# Patient Record
Sex: Male | Born: 1955
Health system: Southern US, Community
[De-identification: ages and names within clinical notes are randomized; demographics above are authoritative.]

## PROBLEM LIST (undated history)

## (undated) DIAGNOSIS — I499 Cardiac arrhythmia, unspecified: Secondary | ICD-10-CM

## (undated) DIAGNOSIS — K259 Gastric ulcer, unspecified as acute or chronic, without hemorrhage or perforation: Secondary | ICD-10-CM

## (undated) DIAGNOSIS — M109 Gout, unspecified: Secondary | ICD-10-CM

## (undated) DIAGNOSIS — J189 Pneumonia, unspecified organism: Secondary | ICD-10-CM

## (undated) DIAGNOSIS — I219 Acute myocardial infarction, unspecified: Secondary | ICD-10-CM

## (undated) DIAGNOSIS — I10 Essential (primary) hypertension: Secondary | ICD-10-CM

## (undated) DIAGNOSIS — I2510 Atherosclerotic heart disease of native coronary artery without angina pectoris: Secondary | ICD-10-CM

## (undated) DIAGNOSIS — H3320 Serous retinal detachment, unspecified eye: Secondary | ICD-10-CM

## (undated) DIAGNOSIS — M719 Bursopathy, unspecified: Secondary | ICD-10-CM

## (undated) DIAGNOSIS — K219 Gastro-esophageal reflux disease without esophagitis: Secondary | ICD-10-CM

## (undated) DIAGNOSIS — M1990 Unspecified osteoarthritis, unspecified site: Secondary | ICD-10-CM

## (undated) DIAGNOSIS — J45909 Unspecified asthma, uncomplicated: Secondary | ICD-10-CM

## (undated) HISTORY — PX: CARDIOVASCULAR STRESS TEST: SHX262

## (undated) HISTORY — PX: COLONOSCOPY: SHX174

## (undated) HISTORY — PX: EYE SURGERY: SHX253

## (undated) HISTORY — PX: TOE AMPUTATION: SHX809

## (undated) HISTORY — PX: WRIST SURGERY: SHX841

## (undated) HISTORY — PX: APPENDECTOMY: SHX54

---

## 2003-01-16 ENCOUNTER — Ambulatory Visit (HOSPITAL_COMMUNITY): Admission: RE | Admit: 2003-01-16 | Discharge: 2003-01-16 | Payer: Self-pay | Admitting: Cardiology

## 2003-02-01 ENCOUNTER — Encounter: Admission: RE | Admit: 2003-02-01 | Discharge: 2003-02-01 | Payer: Self-pay | Admitting: Cardiology

## 2003-10-17 ENCOUNTER — Inpatient Hospital Stay (HOSPITAL_BASED_OUTPATIENT_CLINIC_OR_DEPARTMENT_OTHER): Admission: RE | Admit: 2003-10-17 | Discharge: 2003-10-17 | Payer: Self-pay | Admitting: Cardiology

## 2004-09-15 ENCOUNTER — Emergency Department (HOSPITAL_COMMUNITY): Admission: EM | Admit: 2004-09-15 | Discharge: 2004-09-15 | Payer: Self-pay | Admitting: Emergency Medicine

## 2005-09-07 ENCOUNTER — Emergency Department (HOSPITAL_COMMUNITY): Admission: EM | Admit: 2005-09-07 | Discharge: 2005-09-07 | Payer: Self-pay | Admitting: Emergency Medicine

## 2006-02-04 ENCOUNTER — Emergency Department (HOSPITAL_COMMUNITY): Admission: EM | Admit: 2006-02-04 | Discharge: 2006-02-04 | Payer: Self-pay | Admitting: Emergency Medicine

## 2006-06-23 ENCOUNTER — Emergency Department (HOSPITAL_COMMUNITY): Admission: EM | Admit: 2006-06-23 | Discharge: 2006-06-23 | Payer: Self-pay | Admitting: *Deleted

## 2006-06-26 ENCOUNTER — Encounter: Admission: RE | Admit: 2006-06-26 | Discharge: 2006-06-26 | Payer: Self-pay | Admitting: Cardiology

## 2009-02-16 ENCOUNTER — Encounter (HOSPITAL_COMMUNITY): Admission: RE | Admit: 2009-02-16 | Discharge: 2009-04-11 | Payer: Self-pay | Admitting: Cardiology

## 2009-02-21 ENCOUNTER — Ambulatory Visit: Payer: Self-pay | Admitting: Vascular Surgery

## 2009-02-21 ENCOUNTER — Ambulatory Visit (HOSPITAL_COMMUNITY): Admission: RE | Admit: 2009-02-21 | Discharge: 2009-02-21 | Payer: Self-pay | Admitting: Cardiology

## 2009-02-21 ENCOUNTER — Encounter (INDEPENDENT_AMBULATORY_CARE_PROVIDER_SITE_OTHER): Payer: Self-pay | Admitting: Cardiology

## 2009-04-03 ENCOUNTER — Ambulatory Visit (HOSPITAL_BASED_OUTPATIENT_CLINIC_OR_DEPARTMENT_OTHER): Admission: RE | Admit: 2009-04-03 | Discharge: 2009-04-03 | Payer: Self-pay | Admitting: Orthopedic Surgery

## 2009-06-22 ENCOUNTER — Emergency Department (HOSPITAL_COMMUNITY): Admission: EM | Admit: 2009-06-22 | Discharge: 2009-06-22 | Payer: Self-pay | Admitting: Emergency Medicine

## 2009-07-18 ENCOUNTER — Emergency Department (HOSPITAL_COMMUNITY): Admission: EM | Admit: 2009-07-18 | Discharge: 2009-07-19 | Payer: Self-pay | Admitting: Emergency Medicine

## 2009-08-30 ENCOUNTER — Encounter: Admission: RE | Admit: 2009-08-30 | Discharge: 2009-08-30 | Payer: Self-pay | Admitting: Neurosurgery

## 2009-10-09 ENCOUNTER — Encounter
Admission: RE | Admit: 2009-10-09 | Discharge: 2009-11-14 | Payer: Self-pay | Source: Home / Self Care | Admitting: Neurosurgery

## 2010-01-26 ENCOUNTER — Encounter: Payer: Self-pay | Admitting: Cardiology

## 2010-01-27 ENCOUNTER — Encounter: Payer: Self-pay | Admitting: Cardiology

## 2010-02-14 ENCOUNTER — Other Ambulatory Visit (HOSPITAL_COMMUNITY): Payer: Self-pay | Admitting: Cardiology

## 2010-03-08 ENCOUNTER — Other Ambulatory Visit (HOSPITAL_COMMUNITY): Payer: Self-pay

## 2010-03-11 ENCOUNTER — Ambulatory Visit (HOSPITAL_COMMUNITY)
Admission: RE | Admit: 2010-03-11 | Discharge: 2010-03-11 | Disposition: A | Discharge status: Home / Self Care | Payer: Medicaid Other | Source: Ambulatory Visit | Attending: Cardiology | Admitting: Cardiology

## 2010-03-11 ENCOUNTER — Ambulatory Visit (HOSPITAL_COMMUNITY): Payer: Self-pay

## 2010-03-11 DIAGNOSIS — R51 Headache: Secondary | ICD-10-CM | POA: Insufficient documentation

## 2010-03-11 DIAGNOSIS — R9439 Abnormal result of other cardiovascular function study: Secondary | ICD-10-CM | POA: Insufficient documentation

## 2010-03-11 DIAGNOSIS — R079 Chest pain, unspecified: Secondary | ICD-10-CM | POA: Insufficient documentation

## 2010-03-11 DIAGNOSIS — Z87891 Personal history of nicotine dependence: Secondary | ICD-10-CM | POA: Insufficient documentation

## 2010-03-11 DIAGNOSIS — I10 Essential (primary) hypertension: Secondary | ICD-10-CM | POA: Insufficient documentation

## 2010-03-11 DIAGNOSIS — R0602 Shortness of breath: Secondary | ICD-10-CM | POA: Insufficient documentation

## 2010-03-11 MED ORDER — TECHNETIUM TC 99M TETROFOSMIN IV KIT
30.0000 | PACK | Freq: Once | INTRAVENOUS | Status: AC | PRN
  Administered 2010-03-11: 30 via INTRAVENOUS

## 2010-03-11 MED ORDER — TECHNETIUM TC 99M TETROFOSMIN IV KIT
10.0000 | PACK | Freq: Once | INTRAVENOUS | Status: AC | PRN
  Administered 2010-03-11: 10 via INTRAVENOUS

## 2010-03-19 ENCOUNTER — Ambulatory Visit (HOSPITAL_COMMUNITY)
Admission: RE | Admit: 2010-03-19 | Discharge: 2010-03-19 | Disposition: A | Discharge status: Home / Self Care | Payer: Medicaid Other | Source: Ambulatory Visit | Attending: Cardiology | Admitting: Cardiology

## 2010-03-19 DIAGNOSIS — F17200 Nicotine dependence, unspecified, uncomplicated: Secondary | ICD-10-CM | POA: Insufficient documentation

## 2010-03-19 DIAGNOSIS — E7800 Pure hypercholesterolemia, unspecified: Secondary | ICD-10-CM | POA: Insufficient documentation

## 2010-03-19 DIAGNOSIS — I10 Essential (primary) hypertension: Secondary | ICD-10-CM | POA: Insufficient documentation

## 2010-03-19 DIAGNOSIS — I2510 Atherosclerotic heart disease of native coronary artery without angina pectoris: Secondary | ICD-10-CM | POA: Insufficient documentation

## 2010-03-19 DIAGNOSIS — Z7902 Long term (current) use of antithrombotics/antiplatelets: Secondary | ICD-10-CM | POA: Insufficient documentation

## 2010-03-19 DIAGNOSIS — Z7982 Long term (current) use of aspirin: Secondary | ICD-10-CM | POA: Insufficient documentation

## 2010-03-19 LAB — BASIC METABOLIC PANEL WITH GFR
BUN: 20 mg/dL (ref 6–23)
CO2: 23 meq/L (ref 19–32)
Calcium: 8.8 mg/dL (ref 8.4–10.5)
Chloride: 108 meq/L (ref 96–112)
Creatinine, Ser: 1.13 mg/dL (ref 0.4–1.5)
GFR calc non Af Amer: >60 mL/min
Glucose, Bld: 101 mg/dL — ABNORMAL HIGH (ref 70–99)
Potassium: 3.9 meq/L (ref 3.5–5.1)
Sodium: 137 meq/L (ref 135–145)

## 2010-04-01 LAB — BASIC METABOLIC PANEL
BUN: 11 mg/dL (ref 6–23)
CO2: 28 mEq/L (ref 19–32)
Calcium: 9.6 mg/dL (ref 8.4–10.5)
Chloride: 106 mEq/L (ref 96–112)
Creatinine, Ser: 0.96 mg/dL (ref 0.4–1.5)
GFR calc Af Amer: >60 mL/min (ref >60)
GFR calc non Af Amer: >60 mL/min (ref >60)
Glucose, Bld: 92 mg/dL (ref 70–99)
Potassium: 5 mEq/L (ref 3.5–5.1)
Sodium: 139 mEq/L (ref 135–145)
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
## 2010-05-03 ENCOUNTER — Emergency Department (HOSPITAL_COMMUNITY)
Admission: EM | Admit: 2010-05-03 | Discharge: 2010-05-03 | Disposition: A | Discharge status: Home / Self Care | Payer: Medicaid Other | Attending: Emergency Medicine | Admitting: Emergency Medicine

## 2010-05-03 ENCOUNTER — Emergency Department (HOSPITAL_COMMUNITY): Payer: Medicaid Other

## 2010-05-03 DIAGNOSIS — X500XXA Overexertion from strenuous movement or load, initial encounter: Secondary | ICD-10-CM | POA: Insufficient documentation

## 2010-05-03 DIAGNOSIS — S92909A Unspecified fracture of unspecified foot, initial encounter for closed fracture: Secondary | ICD-10-CM | POA: Insufficient documentation

## 2010-05-03 DIAGNOSIS — I10 Essential (primary) hypertension: Secondary | ICD-10-CM | POA: Insufficient documentation

## 2010-05-03 DIAGNOSIS — E7800 Pure hypercholesterolemia, unspecified: Secondary | ICD-10-CM | POA: Insufficient documentation

## 2010-05-06 ENCOUNTER — Emergency Department (HOSPITAL_COMMUNITY): Payer: Medicaid Other

## 2010-05-06 ENCOUNTER — Emergency Department (HOSPITAL_COMMUNITY)
Admission: EM | Admit: 2010-05-06 | Discharge: 2010-05-06 | Disposition: A | Discharge status: Home / Self Care | Payer: Medicaid Other | Attending: Emergency Medicine | Admitting: Emergency Medicine

## 2010-05-06 DIAGNOSIS — Z8614 Personal history of Methicillin resistant Staphylococcus aureus infection: Secondary | ICD-10-CM | POA: Insufficient documentation

## 2010-05-06 DIAGNOSIS — S52599A Other fractures of lower end of unspecified radius, initial encounter for closed fracture: Secondary | ICD-10-CM | POA: Insufficient documentation

## 2010-05-06 DIAGNOSIS — M25519 Pain in unspecified shoulder: Secondary | ICD-10-CM | POA: Insufficient documentation

## 2010-05-06 DIAGNOSIS — Y92009 Unspecified place in unspecified non-institutional (private) residence as the place of occurrence of the external cause: Secondary | ICD-10-CM | POA: Insufficient documentation

## 2010-05-06 DIAGNOSIS — I10 Essential (primary) hypertension: Secondary | ICD-10-CM | POA: Insufficient documentation

## 2010-05-06 DIAGNOSIS — M25539 Pain in unspecified wrist: Secondary | ICD-10-CM | POA: Insufficient documentation

## 2010-05-06 DIAGNOSIS — W010XXA Fall on same level from slipping, tripping and stumbling without subsequent striking against object, initial encounter: Secondary | ICD-10-CM | POA: Insufficient documentation

## 2010-05-24 NOTE — Cardiovascular Report (Signed)
NAME:  Danny Conner, SHAFF                   ACCOUNT NO.:  [id number]   MEDICAL RECORD NO.:  [id number]          PATIENT TYPE:  OIB   LOCATION:  6501                         FACILITY:  [facility]   PHYSICIAN:  Mohan N. Sharyn Lull, M.D. DATE OF BIRTH:  1955/05/23   DATE OF PROCEDURE:  DATE OF DISCHARGE:                              CARDIAC CATHETERIZATION   DATE OF PROCEDURE:  October 17, 2003.   PROCEDURE:  Left cardiac cath with selective left and right coronary  angiography and LV-graphy via the right groin using Judkins technique.   SURGEON:  Eduardo Osier. Sharyn Lull, MD.   INDICATIONS FOR PROCEDURE:  Danny Conner is a 55 year old black man with past  medical history significant for hypertension, hypercholesterolemia, severe  bronchial asthma, arthritis, bursitis, history of gouty arthritis, tobacco  abuse, and positive family history of coronary artery disease, complains of  retrosternal and precordial chest pain associated with mild shortness of  breath, palpitations, and diaphoresis associated with minimal exertion and  relieved with rest.  Chest pain is grade 7/10, which lasts a few minutes.  He states that left cath done 5 or 6 years ago at [facility] and was told  to have 40-50% blockages.  Patient also gives history of claudication in  veins, smokes less than 1 pack per day now, used to smoke 2-3 packs per day  for 20 plus years.  Denies PND, orthopnea, leg swelling.  Patient recently  had a Persantine-Cardiolite, which showed no evidence of ischemia with an EF  of 42%.  Due to recurrent typical anginal chest, decreased LV function,  multiple risk factors, discussed with patient regarding left cath, its risks  and benefits, and consented for PCI.   PROCEDURE:  After obtaining the informed consent, patient was brought to the  cath lab and was placed on fluoroscopy table.  The right groin was prepped  and draped in the usual fashion.  2% Xylocaine was used for local anesthesia  in the right groin.   With the help of thin-wall needle, a 6-French arterial  sheath was placed.  The sheath was aspirated and flushed.  Next, a 6-French  left Judkins catheter was advanced over the wire under fluoroscopic guidance  up to the ascending aorta.  While it was pulled out, the catheter was  aspirated and connected to the Manifold.  Catheter was further advanced and  engaged into left coronary ostium.  Multiple views of the left system were  taken.  Next, the catheter was disengaged and was pulled out over the wire  and was replaced with a 4-French right Judkins catheter, which was advanced  over the wire under fluoroscopic guidance up to the ascending aorta.  While  it was pulled out, the catheter was aspirated and connected to the Manifold.  Catheter was further advanced and engaged into the right coronary ostium.  Multiple views of the right system were taken.  Next, the catheter was  disengaged and was pulled out over the wire and was replaced with a 6-French  pigtail catheter, which was advanced over the wire under fluoroscopic  guidance up  to the ascending aorta.  While it was pulled out, the catheter  was aspirated and connected to the Manifold.  The catheter was further  advanced across the aortic valve into the LV.  LV pressures were recorded.  Next, an LV-graph was done in 30-degree RAO position.  Post-angiographic  pressures were recorded from LV, and then pullback pressures were recorded  from the aorta.  There was no gradient across the aortic valve.  Next, the  pigtail catheter was pulled out over the wire.  The sheaths were aspirated  and flushed.   FINDINGS:  The LV was mildly enlarged.  LVH EF of 45% approximately.  Left  main was patent.  LAD has 30% ostial stenosis, and 30-40% mid stenosis.  Diagonal 1 to diagonal 3 were very small, which were patient.  The ramus  was less than 0.5 mm, which was very small.  Left circumflex has 30-40% mid  stenosis.  OM-1 was moderate sized, which  has 40-50% proximal stenosis.  RCA  has 40-50% mid and distal junction stenosis.  Patient tolerated the  procedure well.  There were no complications.  Patient was transferred to  the recovery room in stable condition.       MNH/MEDQ  D:  10/17/2003  T:  10/17/2003  Job:  [id number]

## 2010-05-24 NOTE — Op Note (Signed)
NAME:  Danny, Conner                   ACCOUNT NO.:  [id number]   MEDICAL RECORD NO.:  [id number]          PATIENT TYPE:  EMS   LOCATION:  ED                           FACILITY:  [facility]   PHYSICIAN:  Vanita Panda. Magnus Ivan, M.D.DATE OF BIRTH:  06-Nov-1955   DATE OF PROCEDURE:  09/15/2004  DATE OF DISCHARGE:                                 OPERATIVE REPORT   PREOPERATIVE DIAGNOSES:  1.  Right thumb infection.  2.  Left knee superficial abscess.   POSTOPERATIVE DIAGNOSES:  1.  Right thumb complex felon.  2.  Left knee superficial abscess.   PROCEDURE:  1.  Irrigation and debridement of right thumb felon.  2.  Irrigation and debridement of left knee superficial abscess.   SURGEON:  Vanita Panda. Magnus Ivan, M.D.   ANESTHESIA:  0.25% plain Sensorcaine digital block for the right thumb and  local infiltration of skin with 0.25% plain Sensorcaine to the left knee.   ESTIMATED BLOOD LOSS:  Minimal.   COMPLICATIONS:  None.   ANTIBIOTICS:  1 gram IV vancomycin.   INDICATIONS FOR PROCEDURE:  [name] is a 55 year old gentleman who is right  hand dominant. He said that he had a previous hospitalization in  [place] several months ago for a MRSA infection involving his left  hand. He said over the last week, he had developed a similar boil on his  left knee and though it had not been too uncomfortable, it started out as a  scratch but then over the last week, he also developed on his right dominant  thumb exquisite pain and swelling. This worsened over the last 2 days and  then in the last 24 hours he then came to the emergency room to seek further  treatment. Orthopedic surgery was consulted to address the thumb and the  knee. He denied any other injuries to his body. His medical history was  significant for tobacco abuse as well as coronary artery disease, high blood  pressure and hyperlipidemia. These wounds were recognized and it was  recommended he undergo irrigation and  debridement of these at the bedside.  Informed consent was obtained.   DESCRIPTION OF PROCEDURE:  Both the right hand and the left knee were  prepped with Betadine extensively. A digital block was obtained first on the  thumb with 0.25% plain Sensorcaine. Once adequate block was obtained, an  incision was made over the thumb pad and a gross amount of purulence was  encountered here using a mosquito hemostat. Spreading was made around the  tuft of the thumb and in the septa of the thumb. Again this was the only  area growth purulence was encountered. Of note, there was no evidence of  flexor tendon synovitis on exam. Once this was carried out then a syringe  was used with at least a liter of normal saline solution running through the  syringe to clean the thumb. It was then packed with xeroform dressing as a  drain and then dressing was applied to this including Coban. Next, the knee  was addressed, the area around the small superficial  abscess was infiltrated  with Sensorcaine as well. An incision was made over the abscess area and  then a small amount of gross purulence was encountered. This did not appear  to affect the knee joint. There was no erythema associated with it and he  had full range of motion of his knee. The superficial tissue was cleaned  with an additional liter using a large Toomey syringe draining through this.  This likewise was packed with a drain and then dressing was applied to this.  The patient tolerated both these procedures. Tomorrow he is given specific  instructions to begin daily wet to dry dressing changes to both the thumb  and the knee with soaks as well. He will continue on oral doxicycline 100 mg  twice a day and followup __________ in the next 3-4 days in my office.           ______________________________  Vanita Panda. Magnus Ivan, M.D.     CYB/MEDQ  D:  09/15/2004  T:  09/16/2004  Job:  [id number]

## 2010-07-04 ENCOUNTER — Emergency Department (HOSPITAL_COMMUNITY)
Admission: EM | Admit: 2010-07-04 | Discharge: 2010-07-04 | Discharge status: Against Medical Advice | Payer: Medicaid Other | Attending: Emergency Medicine | Admitting: Emergency Medicine

## 2010-07-04 ENCOUNTER — Emergency Department (HOSPITAL_COMMUNITY): Payer: Medicaid Other

## 2010-07-04 DIAGNOSIS — F17200 Nicotine dependence, unspecified, uncomplicated: Secondary | ICD-10-CM | POA: Insufficient documentation

## 2010-07-04 DIAGNOSIS — R6889 Other general symptoms and signs: Secondary | ICD-10-CM | POA: Insufficient documentation

## 2010-07-04 DIAGNOSIS — I2510 Atherosclerotic heart disease of native coronary artery without angina pectoris: Secondary | ICD-10-CM | POA: Insufficient documentation

## 2010-07-04 DIAGNOSIS — I38 Endocarditis, valve unspecified: Secondary | ICD-10-CM | POA: Insufficient documentation

## 2010-07-04 DIAGNOSIS — E785 Hyperlipidemia, unspecified: Secondary | ICD-10-CM | POA: Insufficient documentation

## 2010-07-04 DIAGNOSIS — I10 Essential (primary) hypertension: Secondary | ICD-10-CM | POA: Insufficient documentation

## 2010-07-04 DIAGNOSIS — R1110 Vomiting, unspecified: Secondary | ICD-10-CM | POA: Insufficient documentation

## 2010-07-04 DIAGNOSIS — R5381 Other malaise: Secondary | ICD-10-CM | POA: Insufficient documentation

## 2010-07-04 DIAGNOSIS — R404 Transient alteration of awareness: Secondary | ICD-10-CM | POA: Insufficient documentation

## 2010-07-04 DIAGNOSIS — R55 Syncope and collapse: Secondary | ICD-10-CM | POA: Insufficient documentation

## 2010-07-04 LAB — URINALYSIS, ROUTINE W REFLEX MICROSCOPIC
Bilirubin Urine: NEGATIVE
Glucose, UA: NEGATIVE mg/dL
Hgb urine dipstick: NEGATIVE
Ketones, ur: NEGATIVE mg/dL
Leukocytes, UA: NEGATIVE
Nitrite: NEGATIVE
Protein, ur: NEGATIVE mg/dL
Specific Gravity, Urine: 1.028 (ref 1.005–1.030)
Urobilinogen, UA: 0.2 mg/dL (ref 0.0–1.0)
pH: 5.5 (ref 5.0–8.0)

## 2010-07-04 LAB — CBC
HCT: 38.9 % — ABNORMAL LOW (ref 39.0–52.0)
Hemoglobin: 13 g/dL (ref 13.0–17.0)
MCH: 30.5 pg (ref 26.0–34.0)
MCHC: 33.4 g/dL (ref 30.0–36.0)
MCV: 91.3 fL (ref 78.0–100.0)
Platelets: 186 10*3/uL (ref 150–400)
RBC: 4.26 MIL/uL (ref 4.22–5.81)
RDW: 13 % (ref 11.5–15.5)
WBC: 10.9 10*3/uL — ABNORMAL HIGH (ref 4.0–10.5)

## 2010-07-04 LAB — COMPREHENSIVE METABOLIC PANEL
ALT: 11 U/L (ref 0–53)
AST: 19 U/L (ref 0–37)
Albumin: 3.8 g/dL (ref 3.5–5.2)
Alkaline Phosphatase: 38 U/L — ABNORMAL LOW (ref 39–117)
BUN: 17 mg/dL (ref 6–23)
CO2: 24 mEq/L (ref 19–32)
Calcium: 8.2 mg/dL — ABNORMAL LOW (ref 8.4–10.5)
Chloride: 104 mEq/L (ref 96–112)
Creatinine, Ser: 0.92 mg/dL (ref 0.50–1.35)
GFR calc Af Amer: >60 mL/min (ref >60)
GFR calc non Af Amer: >60 mL/min (ref >60)
Glucose, Bld: 107 mg/dL — ABNORMAL HIGH (ref 70–99)
Potassium: 4.4 mEq/L (ref 3.5–5.1)
Sodium: 137 mEq/L (ref 135–145)
Total Bilirubin: 0.5 mg/dL (ref 0.3–1.2)
Total Protein: 6.9 g/dL (ref 6.0–8.3)

## 2010-07-04 LAB — RAPID URINE DRUG SCREEN, HOSP PERFORMED
Amphetamines: NOT DETECTED
Barbiturates: NOT DETECTED
Benzodiazepines: NOT DETECTED
Cocaine: NOT DETECTED
Opiates: NOT DETECTED
Tetrahydrocannabinol: POSITIVE — AB

## 2010-07-04 LAB — DIFFERENTIAL
Basophils Absolute: 0 10*3/uL (ref 0.0–0.1)
Basophils Relative: 0 % (ref 0–1)
Eosinophils Absolute: 0.1 10*3/uL (ref 0.0–0.7)
Eosinophils Relative: 1 % (ref 0–5)
Lymphocytes Relative: 9 % — ABNORMAL LOW (ref 12–46)
Lymphs Abs: 1 10*3/uL (ref 0.7–4.0)
Monocytes Absolute: 0.7 10*3/uL (ref 0.1–1.0)
Monocytes Relative: 6 % (ref 3–12)
Neutro Abs: 9.1 10*3/uL — ABNORMAL HIGH (ref 1.7–7.7)
Neutrophils Relative %: 84 % — ABNORMAL HIGH (ref 43–77)

## 2010-07-04 LAB — CK TOTAL AND CKMB (NOT AT ARMC)
CK, MB: 3.5 ng/mL (ref 0.3–4.0)
Relative Index: 1.7 (ref 0.0–2.5)
Total CK: 208 U/L (ref 7–232)

## 2010-07-04 LAB — TROPONIN I: Troponin I: <0.3 ng/mL (ref <0.30)

## 2010-07-04 LAB — LIPASE, BLOOD: Lipase: 73 U/L — ABNORMAL HIGH (ref 11–59)

## 2010-08-29 ENCOUNTER — Ambulatory Visit: Payer: Medicaid Other | Attending: Orthopedic Surgery | Admitting: Occupational Therapy

## 2010-10-23 LAB — CBC
HCT: 38.8 — ABNORMAL LOW
Hemoglobin: 13
MCHC: 33.4
MCV: 89.9
Platelets: 245
RBC: 4.32
RDW: 13.2
WBC: 10.3

## 2010-10-23 LAB — URINALYSIS, ROUTINE W REFLEX MICROSCOPIC
Bilirubin Urine: NEGATIVE
Glucose, UA: NEGATIVE
Hgb urine dipstick: NEGATIVE
Ketones, ur: NEGATIVE
Nitrite: NEGATIVE
Protein, ur: NEGATIVE
Specific Gravity, Urine: 1.02
Urobilinogen, UA: 0.2
pH: 7

## 2010-10-23 LAB — DIFFERENTIAL
Basophils Absolute: 0.1
Basophils Relative: 1
Eosinophils Absolute: 0.3
Eosinophils Relative: 3
Lymphocytes Relative: 9 — ABNORMAL LOW
Lymphs Abs: 0.9
Monocytes Absolute: 0.5
Monocytes Relative: 5
Neutro Abs: 8.5 — ABNORMAL HIGH
Neutrophils Relative %: 82 — ABNORMAL HIGH

## 2010-10-23 LAB — COMPREHENSIVE METABOLIC PANEL
ALT: 13
AST: 24
Albumin: 3.5
Alkaline Phosphatase: 39
BUN: 11
CO2: 23
Calcium: 8.8
Chloride: 107
Creatinine, Ser: 0.98
GFR calc Af Amer: >60
GFR calc non Af Amer: >60
Glucose, Bld: 127 — ABNORMAL HIGH
Potassium: 4
Sodium: 137
Total Bilirubin: 1.2
Total Protein: 6.8

## 2010-10-23 LAB — ETHANOL: Alcohol, Ethyl (B): <5

## 2010-10-23 LAB — LIPASE, BLOOD: Lipase: 39

## 2012-03-02 ENCOUNTER — Other Ambulatory Visit (HOSPITAL_COMMUNITY): Payer: Self-pay | Admitting: Cardiology

## 2012-03-02 DIAGNOSIS — R079 Chest pain, unspecified: Secondary | ICD-10-CM

## 2012-03-10 ENCOUNTER — Encounter (HOSPITAL_COMMUNITY)
Admission: RE | Admit: 2012-03-10 | Discharge: 2012-03-10 | Disposition: A | Discharge status: Home / Self Care | Payer: Medicaid Other | Source: Ambulatory Visit | Attending: Cardiology | Admitting: Cardiology

## 2012-03-10 ENCOUNTER — Ambulatory Visit (HOSPITAL_COMMUNITY)
Admission: RE | Admit: 2012-03-10 | Discharge: 2012-03-10 | Disposition: A | Discharge status: Home / Self Care | Payer: Medicaid Other | Source: Ambulatory Visit | Attending: Cardiology | Admitting: Cardiology

## 2012-03-10 DIAGNOSIS — R079 Chest pain, unspecified: Secondary | ICD-10-CM | POA: Insufficient documentation

## 2012-03-10 DIAGNOSIS — R9439 Abnormal result of other cardiovascular function study: Secondary | ICD-10-CM | POA: Insufficient documentation

## 2012-03-10 DIAGNOSIS — I4949 Other premature depolarization: Secondary | ICD-10-CM | POA: Insufficient documentation

## 2012-03-10 MED ORDER — TECHNETIUM TC 99M SESTAMIBI GENERIC - CARDIOLITE
30.0000 | Freq: Once | INTRAVENOUS | Status: AC | PRN
  Administered 2012-03-10: 30 via INTRAVENOUS

## 2012-03-10 MED ORDER — REGADENOSON 0.4 MG/5ML IV SOLN
INTRAVENOUS | Status: AC
  Filled 2012-03-10: qty 5

## 2012-03-10 MED ORDER — REGADENOSON 0.4 MG/5ML IV SOLN
0.4000 mg | Freq: Once | INTRAVENOUS | Status: AC
  Administered 2012-03-10: 0.4 mg via INTRAVENOUS

## 2012-03-10 MED ORDER — TECHNETIUM TC 99M SESTAMIBI GENERIC - CARDIOLITE
10.0000 | Freq: Once | INTRAVENOUS | Status: AC | PRN
  Administered 2012-03-10: 10 via INTRAVENOUS

## 2012-04-14 ENCOUNTER — Emergency Department (HOSPITAL_COMMUNITY)
Admission: EM | Admit: 2012-04-14 | Discharge: 2012-04-15 | Disposition: A | Discharge status: Home / Self Care | Payer: Medicaid Other | Attending: Emergency Medicine | Admitting: Emergency Medicine

## 2012-04-14 ENCOUNTER — Encounter (HOSPITAL_COMMUNITY): Payer: Self-pay | Admitting: Family Medicine

## 2012-04-14 DIAGNOSIS — R05 Cough: Secondary | ICD-10-CM | POA: Insufficient documentation

## 2012-04-14 DIAGNOSIS — J40 Bronchitis, not specified as acute or chronic: Secondary | ICD-10-CM

## 2012-04-14 DIAGNOSIS — R059 Cough, unspecified: Secondary | ICD-10-CM

## 2012-04-14 DIAGNOSIS — Z7982 Long term (current) use of aspirin: Secondary | ICD-10-CM | POA: Insufficient documentation

## 2012-04-14 DIAGNOSIS — J45909 Unspecified asthma, uncomplicated: Secondary | ICD-10-CM | POA: Insufficient documentation

## 2012-04-14 DIAGNOSIS — I10 Essential (primary) hypertension: Secondary | ICD-10-CM | POA: Insufficient documentation

## 2012-04-14 DIAGNOSIS — Z7902 Long term (current) use of antithrombotics/antiplatelets: Secondary | ICD-10-CM | POA: Insufficient documentation

## 2012-04-14 DIAGNOSIS — R079 Chest pain, unspecified: Secondary | ICD-10-CM

## 2012-04-14 DIAGNOSIS — R0602 Shortness of breath: Secondary | ICD-10-CM | POA: Insufficient documentation

## 2012-04-14 DIAGNOSIS — R509 Fever, unspecified: Secondary | ICD-10-CM | POA: Insufficient documentation

## 2012-04-14 DIAGNOSIS — Z720 Tobacco use: Secondary | ICD-10-CM

## 2012-04-14 DIAGNOSIS — F17200 Nicotine dependence, unspecified, uncomplicated: Secondary | ICD-10-CM | POA: Insufficient documentation

## 2012-04-14 HISTORY — DX: Unspecified asthma, uncomplicated: J45.909

## 2012-04-14 HISTORY — DX: Essential (primary) hypertension: I10

## 2012-04-14 NOTE — ED Notes (Signed)
Patient presents c/o cough, shortness of breath and chest pain. States chest pain has been coming off and on for 3 weeks. Has had a cough for the past couple of days with shortness of breath that is getting worse.

## 2012-04-14 NOTE — ED Notes (Signed)
ZOX:[id number]<AV> Expected date:<BR> Expected time:<BR> Means of arrival:<BR> Comments:<BR> TRIAGE 2

## 2012-04-15 ENCOUNTER — Emergency Department (HOSPITAL_COMMUNITY): Payer: Medicaid Other

## 2012-04-15 LAB — CBC WITH DIFFERENTIAL/PLATELET
Basophils Absolute: 0 10*3/uL (ref 0.0–0.1)
Basophils Relative: 0 % (ref 0–1)
Eosinophils Absolute: 0.3 10*3/uL (ref 0.0–0.7)
Eosinophils Relative: 4 % (ref 0–5)
HCT: 38.6 % — ABNORMAL LOW (ref 39.0–52.0)
Hemoglobin: 12.8 g/dL — ABNORMAL LOW (ref 13.0–17.0)
Lymphocytes Relative: 26 % (ref 12–46)
Lymphs Abs: 1.9 10*3/uL (ref 0.7–4.0)
MCH: 29.7 pg (ref 26.0–34.0)
MCHC: 33.2 g/dL (ref 30.0–36.0)
MCV: 89.6 fL (ref 78.0–100.0)
Monocytes Absolute: 0.8 10*3/uL (ref 0.1–1.0)
Monocytes Relative: 11 % (ref 3–12)
Neutro Abs: 4.2 10*3/uL (ref 1.7–7.7)
Neutrophils Relative %: 59 % (ref 43–77)
Platelets: 262 10*3/uL (ref 150–400)
RBC: 4.31 MIL/uL (ref 4.22–5.81)
RDW: 13.2 % (ref 11.5–15.5)
WBC: 7.1 10*3/uL (ref 4.0–10.5)

## 2012-04-15 LAB — BASIC METABOLIC PANEL
BUN: 22 mg/dL (ref 6–23)
CO2: 24 mEq/L (ref 19–32)
Calcium: 9.1 mg/dL (ref 8.4–10.5)
Chloride: 102 mEq/L (ref 96–112)
Creatinine, Ser: 0.99 mg/dL (ref 0.50–1.35)
GFR calc Af Amer: >90 mL/min (ref >90)
GFR calc non Af Amer: 89 mL/min — ABNORMAL LOW (ref >90)
Glucose, Bld: 129 mg/dL — ABNORMAL HIGH (ref 70–99)
Potassium: 3.5 mEq/L (ref 3.5–5.1)
Sodium: 137 mEq/L (ref 135–145)

## 2012-04-15 LAB — POCT I-STAT TROPONIN I: Troponin i, poc: 0.01 ng/mL (ref 0.00–0.08)

## 2012-04-15 MED ORDER — ASPIRIN 81 MG PO CHEW
324.0000 mg | CHEWABLE_TABLET | Freq: Once | ORAL | Status: AC
  Administered 2012-04-15: 324 mg via ORAL
  Filled 2012-04-15: qty 4

## 2012-04-15 MED ORDER — GUAIFENESIN ER 600 MG PO TB12
1200.0000 mg | ORAL_TABLET | Freq: Two times a day (BID) | ORAL | Status: DC
  Administered 2012-04-15: 1200 mg via ORAL
  Filled 2012-04-15: qty 2

## 2012-04-15 MED ORDER — GUAIFENESIN ER 600 MG PO TB12: 600.0000 mg | ORAL_TABLET | Freq: Two times a day (BID) | ORAL | Status: DC

## 2012-04-15 NOTE — ED Provider Notes (Addendum)
History     CSN: [id number]  Arrival date & time 04/14/12  2226   First MD Initiated Contact with Patient 04/14/12 2338      Chief Complaint  Patient presents with  . Chest Pain  . Shortness of Breath  . Cough    HPI Danny Conner is a 57 y.o. male presents with 3 weeks history of chest pain. He says the pain is on the left side of the chest is been intermittent and lasts for varying periods of time, but occasionally associated with shortness of breath, and a cough the last 3 weeks and is still a quarter to half pack smoker daily. (Coughing, SOB and Chest pain are temporally related.) He denies any nausea, vomiting, diaphoresis. He says the pain has been severe over the course the last couple weeks and days however it has gotten better since coming to the emergency department patient's history is also significant for prior cocaine use, hypertension, hypercholesterolemia pertinent family history of coronary artery disease including a father dying of MI at age 58, mother dying of MI at age 88, 3 brothers having coronary artery disease requiring PTCA stenting, one brother required CABG.  Patient recently had a Myoview stress test on 2/25 performed by Dr. Sharyn Lull his cardiologist which was negative for inducible ischemia. Patient's most recent cardiac catheterization was 03/19/2010 this showed diffuse coronary artery disease however no stenting was performed, no angioplasty, Dr. Annitta Jersey plan was to maximize anti-angina medications and lifestyle modifications patient denies any alcohol or illicit drug use.    Past Medical History  Diagnosis Date  . Hypertension   . Asthma     Past Surgical History  Procedure Laterality Date  . Cardiovascular stress test    . Appendectomy      No family history on file.  History  Substance Use Topics  . Smoking status: Current Every Day Smoker -- 0.50 packs/day    Types: Cigarettes  . Smokeless tobacco: Not on file  . Alcohol Use: No      Review of  Systems At least 10pt or greater review of systems completed and are negative except where specified in the HPI.  Allergies  Codeine  Home Medications   Current Outpatient Rx  Name  Route  Sig  Dispense  Refill  . aspirin 81 MG tablet   Oral   Take 81 mg by mouth daily.         . clopidogrel (PLAVIX) 75 MG tablet   Oral   Take 75 mg by mouth daily.           BP 140/95  Pulse 78  Temp(Src) 99.3 F (37.4 C) (Oral)  Resp 18  SpO2 96%  Physical Exam  Nursing notes reviewed.  Electronic medical record reviewed. VITAL SIGNS:   Filed Vitals:   04/14/12 2231 04/14/12 2355  BP: 140/95   Pulse: 78   Temp: 99.3 F (37.4 C)   TempSrc: Oral   Resp: 18   SpO2: 96% 96%   CONSTITUTIONAL: Awake, oriented, appears non-toxic HENT: Atraumatic, normocephalic, oral mucosa pink and moist, airway patent. Nares patent without drainage. External ears normal. EYES: Conjunctiva clear, EOMI, PERRLA NECK: Trachea midline, non-tender, supple CARDIOVASCULAR: Normal heart rate, Normal rhythm, No murmurs, rubs, gallops PULMONARY/CHEST: Clear to auscultation, no rhonchi, wheezes, or rales. Symmetrical breath sounds. Non-tender. ABDOMINAL: Non-distended, soft, non-tender - no rebound or guarding.  BS normal. NEUROLOGIC: Non-focal, moving all four extremities, no gross sensory or motor deficits. EXTREMITIES: No clubbing, cyanosis, or edema  SKIN: Warm, Dry, No erythema, No rash  ED Course  Procedures (including critical care time)  Date: 04/15/2012  Rate: 86  Rhythm: normal sinus rhythm  QRS Axis: normal  Intervals: First degree AV block  ST/T Wave abnormalities: normal  Conduction Disutrbances: none  Narrative Interpretation: unremarkable, PVC - no significant change from prior EKG  Labs Reviewed  CBC WITH DIFFERENTIAL - Abnormal; Notable for the following:    Hemoglobin 12.8 (*)    HCT 38.6 (*)    All other components within normal limits  BASIC METABOLIC PANEL - Abnormal; Notable  for the following:    Glucose, Bld 129 (*)    GFR calc non Af Amer 89 (*)    All other components within normal limits  POCT I-STAT TROPONIN I   Dg Chest 2 View  04/15/2012  *RADIOLOGY REPORT*  Clinical Data: Chest pain, shortness of breath and cough.  Fever.  CHEST - 2 VIEW  Comparison: Chest radiograph performed 07/04/2010  Findings: The lungs are relatively well-aerated.  Mild chronic peribronchial thickening is seen.  No focal consolidation, pleural effusion or pneumothorax is identified.  The heart is normal in size; the mediastinal contour is within normal limits.  No acute osseous abnormalities are seen.  IMPRESSION: Mild chronic peribronchial thickening noted; lungs otherwise grossly clear.   Original Report Authenticated By: Tonia Ghent, M.D.      1. Chest pain   2. Cough   3. Bronchitis   4. Tobacco abuse       MDM  [name] is a 57 y.o. male with known coronary artery disease, pertinent family history, presents with chest pain for 2-3 weeks and most recently associated with a cough.  Patient is still a daily smoker, and he says he's taking his medicine as prescribed his aspirin and Plavix. Do not have any record of this patient taking antihypertensives currently, but the patient is supposed to be taking metoprolol, Imdur, lisinopril and Crestor. Patient's also supposed to be using nitroglycerin. Patient does say that he stopped taking his nitrates because they're giving him a bad headache. My initial impression is that this patient has had some bronchitis with his chronic cough for 3 weeks.  In looking back at the patient's cardiac catheterization, he has significant and diffuse cardiac disease, there is no interventions done at that time, Dr. Lennette Bihari is no on 03/19/2010 from cardiac catheterization that he was intending to maximize anti-angina medications and lifestyle modifications. Patient says that he has not used cocaine in the last at least 10 years.  Patient workup  is unremarkable. His EKG is within normal limits, there are no ST elevations or depressions consistent with ischemia or infarction. Troponin is negative. This pain has been present for 3 weeks, he came in tonight because it has been persistent and wanted to get it checked out. Is been no acute change, so I would expect a troponin to be positive if this were cardiac. Patient had a Myoview performed in on February 25 of this year which was negative.    Discussed this patient's care with Dr. Sharyn Lull, Dr. Sharyn Lull will see the patient later today-I discussed this with the patient and he agrees that he would like to see Dr. Sharyn Lull the office for further discussions. I told the patient is likely having chest pain secondary to bronchitis, we will give him some medication to help move his mucus.  There is possible he is also having anginal chest pain, patient understands this as well, I told  him that he really needs to be on nitrate medication and that should help his pain if it were related to angina.  Patient does not need to go to the Cath Lab, he's had a recent Myoview - I. do not think this patient needs to be admitted, Dr. Sharyn Lull and the patient are in agreement with this assessment.  We'll discharge the patient home stable and in good condition with instructions to return to the emergency department should any new chest pain start, worsen or be associated with any other concerning symptoms. Patient understands accepts the medical plan as it's been dictated and agrees with it. His questions have been answered.           Jones Skene, MD 04/15/12 0830  Jones Skene, MD 04/15/12 4540

## 2012-09-17 ENCOUNTER — Emergency Department (HOSPITAL_COMMUNITY)
Admission: EM | Admit: 2012-09-17 | Discharge: 2012-09-17 | Disposition: A | Discharge status: Home / Self Care | Payer: Medicaid Other | Attending: Emergency Medicine | Admitting: Emergency Medicine

## 2012-09-17 ENCOUNTER — Emergency Department (HOSPITAL_COMMUNITY): Payer: Medicaid Other

## 2012-09-17 ENCOUNTER — Encounter (HOSPITAL_COMMUNITY): Payer: Self-pay | Admitting: Cardiology

## 2012-09-17 DIAGNOSIS — F17200 Nicotine dependence, unspecified, uncomplicated: Secondary | ICD-10-CM | POA: Insufficient documentation

## 2012-09-17 DIAGNOSIS — M79609 Pain in unspecified limb: Secondary | ICD-10-CM | POA: Insufficient documentation

## 2012-09-17 DIAGNOSIS — J45909 Unspecified asthma, uncomplicated: Secondary | ICD-10-CM | POA: Insufficient documentation

## 2012-09-17 DIAGNOSIS — Z79899 Other long term (current) drug therapy: Secondary | ICD-10-CM | POA: Insufficient documentation

## 2012-09-17 DIAGNOSIS — T4995XA Adverse effect of unspecified topical agent, initial encounter: Secondary | ICD-10-CM | POA: Insufficient documentation

## 2012-09-17 DIAGNOSIS — M7989 Other specified soft tissue disorders: Secondary | ICD-10-CM | POA: Insufficient documentation

## 2012-09-17 DIAGNOSIS — I10 Essential (primary) hypertension: Secondary | ICD-10-CM | POA: Insufficient documentation

## 2012-09-17 DIAGNOSIS — Z7982 Long term (current) use of aspirin: Secondary | ICD-10-CM | POA: Insufficient documentation

## 2012-09-17 DIAGNOSIS — M79642 Pain in left hand: Secondary | ICD-10-CM

## 2012-09-17 DIAGNOSIS — T7840XA Allergy, unspecified, initial encounter: Secondary | ICD-10-CM

## 2012-09-17 MED ORDER — PREDNISONE 20 MG PO TABS: ORAL_TABLET | ORAL | Status: DC

## 2012-09-17 MED ORDER — PREDNISONE 20 MG PO TABS
60.0000 mg | ORAL_TABLET | Freq: Once | ORAL | Status: AC
  Administered 2012-09-17: 60 mg via ORAL
  Filled 2012-09-17: qty 3

## 2012-09-17 MED ORDER — HYDROCODONE-ACETAMINOPHEN 5-325 MG PO TABS: 1.0000 | ORAL_TABLET | ORAL | Status: DC | PRN

## 2012-09-17 MED ORDER — HYDROCODONE-ACETAMINOPHEN 5-325 MG PO TABS
2.0000 | ORAL_TABLET | Freq: Once | ORAL | Status: AC
  Administered 2012-09-17: 2 via ORAL
  Filled 2012-09-17: qty 2

## 2012-09-17 NOTE — ED Provider Notes (Signed)
CSN: [id number]     Arrival date & time 09/17/12  1344 History   First MD Initiated Contact with Patient 09/17/12 1358     Chief Complaint  Patient presents with  . Hand Pain   (Consider location/radiation/quality/duration/timing/severity/associated sxs/prior Treatment) HPI Comments: 57 y/o male presents to the ED complaining of left hand pain and swelling that he noticed when he woke up this morning. Patient states he was working outside in the yard yesterday, had his hand inside pushes, unsure if he was bit by anything. Pain is "pretty bad" worse with movement. Denies difficulty breathing, swallowing, rashes. No known injury or trauma, however he does state he may have hit his hand on a tree. Denies numbness or tingling.  Patient is a 57 y.o. male presenting with hand pain. The history is provided by the patient.  Hand Pain Pertinent negatives include no chills or fever.    Past Medical History  Diagnosis Date  . Hypertension   . Asthma    Past Surgical History  Procedure Laterality Date  . Cardiovascular stress test    . Appendectomy     History reviewed. No pertinent family history. History  Substance Use Topics  . Smoking status: Current Every Day Smoker -- 0.50 packs/day    Types: Cigarettes  . Smokeless tobacco: Not on file  . Alcohol Use: Yes    Review of Systems  Constitutional: Negative for fever and chills.  HENT: Negative for trouble swallowing.   Respiratory: Negative for shortness of breath and wheezing.   Musculoskeletal:       Positive for left hand pain and swelling.  All other systems reviewed and are negative.    Allergies  Codeine  Home Medications   Current Outpatient Rx  Name  Route  Sig  Dispense  Refill  . aspirin 81 MG tablet   Oral   Take 81 mg by mouth daily.         . clopidogrel (PLAVIX) 75 MG tablet   Oral   Take 75 mg by mouth daily.         Marland Kitchen guaiFENesin (MUCINEX) 600 MG 12 hr tablet   Oral   Take 1 tablet (600 mg total)  by mouth 2 (two) times daily.   30 tablet   0    BP 138/86  Pulse 70  Temp(Src) 97.7 F (36.5 C) (Oral)  Resp 18  Ht 5\' 8"  (1.727 m)  Wt 160 lb (72.576 kg)  BMI 24.33 kg/m2  SpO2 97% Physical Exam  Nursing note and vitals reviewed. Constitutional: He is oriented to person, place, and time. He appears well-developed and well-nourished. No distress.  HENT:  Head: Normocephalic and atraumatic.  Mouth/Throat: Oropharynx is clear and moist.  Eyes: Conjunctivae are normal.  Neck: Normal range of motion. Neck supple.  Cardiovascular: Normal rate, regular rhythm, normal heart sounds and intact distal pulses.   Capillary refill < 3 seconds.  Pulmonary/Chest: Effort normal and breath sounds normal.  Musculoskeletal: Normal range of motion. He exhibits no edema.  Tender to palpation of 2-3 metacarpals with swelling. Pain increased with hand flexion. No erythema, bruising, wounds, warmth.  Neurological: He is alert and oriented to person, place, and time. No sensory deficit.  Skin: Skin is warm, dry and intact. No rash noted. He is not diaphoretic.  Psychiatric: He has a normal mood and affect. His behavior is normal.    ED Course  Procedures (including critical care time) Labs Review Labs Reviewed - No data to display  Imaging Review Dg Hand Complete Left  09/17/2012   *RADIOLOGY REPORT*  Clinical Data: Pain second and third metacarpal.  No specific injury.  Limited range of motion.  LEFT HAND - COMPLETE 3+ VIEW  Comparison: None.  Findings: No acute fracture or dislocation.  Question remote fracture left first metacarpal. Mild degenerative changes first metacarpal phalangeal joint space.  No bony erosion.  IMPRESSION: Question remote fracture left first metacarpal. Mild degenerative changes first metacarpal phalangeal joint space.   Original Report Authenticated By: Lacy Duverney, M.D.    MDM   1. Hand pain, left   2. Allergic reaction, initial encounter    Patient with pain and  swelling to left hand after working in the yard yesterday. States he possibly hit his hand on a tree, but unsure. Unsure if he was bit by anything. Swelling noted over the second and third metacarpals. Pain increased with hand flexion. Neurovascularly intact. X-ray showing a remote fracture over the left first metacarpal, however no acute fracture seen. There is no overlying erythema or warmth. 60 mg prednisone given in the emergency department in the event that this was from a bug bite, Vicodin for pain. Ace wrap applied. He will be given a short course of prednisone and Vicodin for pain. Advised ice and elevation. Followup with orthopedics and PCP. Close return precautions discussed. Patient states understanding of plan and is agreeable.    Trevor Mace, PA-C 09/17/12 1526

## 2012-09-17 NOTE — ED Notes (Signed)
Pt reports he was working in the yard yesterday and woke up this morning with swelling to the left hand. Swelling noted to top of hand, no redness noted. Pt reports increased pain with movement. 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

## 2012-09-18 NOTE — ED Provider Notes (Signed)
Medical screening examination/treatment/procedure(s) were performed by non-physician practitioner and as supervising physician I was immediately available for consultation/collaboration.  Dshaun Reppucci L Daysi Boggan, MD 09/18/12 0959 

## 2012-10-14 ENCOUNTER — Other Ambulatory Visit (HOSPITAL_COMMUNITY): Payer: Self-pay | Admitting: Internal Medicine

## 2012-10-14 DIAGNOSIS — R569 Unspecified convulsions: Secondary | ICD-10-CM

## 2012-10-22 ENCOUNTER — Ambulatory Visit (HOSPITAL_COMMUNITY)
Admission: RE | Admit: 2012-10-22 | Discharge: 2012-10-22 | Disposition: A | Discharge status: Home / Self Care | Payer: Medicaid Other | Source: Ambulatory Visit | Attending: Internal Medicine | Admitting: Internal Medicine

## 2012-10-22 DIAGNOSIS — R569 Unspecified convulsions: Secondary | ICD-10-CM

## 2012-10-22 DIAGNOSIS — R404 Transient alteration of awareness: Secondary | ICD-10-CM | POA: Insufficient documentation

## 2012-10-22 DIAGNOSIS — R4182 Altered mental status, unspecified: Secondary | ICD-10-CM | POA: Insufficient documentation

## 2012-10-22 DIAGNOSIS — R4789 Other speech disturbances: Secondary | ICD-10-CM | POA: Insufficient documentation

## 2012-10-22 NOTE — Progress Notes (Signed)
EEG Completed; Results Pending  

## 2012-10-22 NOTE — Procedures (Signed)
ELECTROENCEPHALOGRAM REPORT   Patient: Danny Conner       Room #: OP EEG No. ID: [id number] Age: 56 y.o.        Sex: male Referring Physician: Avbuere Report Date:  10/22/2012        Interpreting Physician: Aline Brochure  History: Danny Conner is an 57 y.o. male with a history of altered consciousness transiently, during which she had difficulty with speech output.  Indications for study:  Rule out focal seizure disorder.  Technique: This is an 18 channel routine scalp EEG performed at the bedside with bipolar and monopolar montages arranged in accordance to the international 10/20 system of electrode placement.   Description: This EEG recording was performed during wakefulness and during light sleep. Background activity during wakefulness consisted of 10 Hz symmetrical alpha rhythm which attenuates well with eye opening. Photic stimulation produced a symmetrical occipital driving response. Hyperventilation was not performed. During light sleep there was generalized slowing of background activity with mixed irregular symmetrical delta and theta activity. Stage II sleep was not achieved. No epileptiform discharges were recorded. There was no abnormal slowing.  Interpretation: This is a normal EEG recording during wakefulness and during light sleep. There is no evidence of an epileptic disorder demonstrated.   Venetia Maxon M.D. [facility] [phone]

## 2013-02-09 ENCOUNTER — Other Ambulatory Visit: Payer: Self-pay | Admitting: Internal Medicine

## 2013-02-09 DIAGNOSIS — R42 Dizziness and giddiness: Secondary | ICD-10-CM

## 2013-02-15 ENCOUNTER — Ambulatory Visit
Admission: RE | Admit: 2013-02-15 | Discharge: 2013-02-15 | Disposition: A | Discharge status: Home / Self Care | Payer: Medicaid Other | Source: Ambulatory Visit | Attending: Internal Medicine | Admitting: Internal Medicine

## 2013-02-15 ENCOUNTER — Inpatient Hospital Stay: Admission: RE | Admit: 2013-02-15 | Payer: Medicaid Other | Source: Ambulatory Visit

## 2013-02-15 ENCOUNTER — Other Ambulatory Visit: Payer: Self-pay | Admitting: Internal Medicine

## 2013-02-15 DIAGNOSIS — R42 Dizziness and giddiness: Secondary | ICD-10-CM

## 2013-06-14 ENCOUNTER — Emergency Department (HOSPITAL_COMMUNITY)
Admission: EM | Admit: 2013-06-14 | Discharge: 2013-06-15 | Disposition: A | Discharge status: Home / Self Care | Payer: Medicaid Other | Attending: Emergency Medicine | Admitting: Emergency Medicine

## 2013-06-14 ENCOUNTER — Encounter (HOSPITAL_COMMUNITY): Payer: Self-pay | Admitting: Emergency Medicine

## 2013-06-14 DIAGNOSIS — H539 Unspecified visual disturbance: Secondary | ICD-10-CM | POA: Insufficient documentation

## 2013-06-14 DIAGNOSIS — F17200 Nicotine dependence, unspecified, uncomplicated: Secondary | ICD-10-CM | POA: Insufficient documentation

## 2013-06-14 DIAGNOSIS — Z7902 Long term (current) use of antithrombotics/antiplatelets: Secondary | ICD-10-CM | POA: Insufficient documentation

## 2013-06-14 DIAGNOSIS — Z7982 Long term (current) use of aspirin: Secondary | ICD-10-CM | POA: Insufficient documentation

## 2013-06-14 DIAGNOSIS — Z79899 Other long term (current) drug therapy: Secondary | ICD-10-CM | POA: Insufficient documentation

## 2013-06-14 DIAGNOSIS — I2510 Atherosclerotic heart disease of native coronary artery without angina pectoris: Secondary | ICD-10-CM | POA: Insufficient documentation

## 2013-06-14 DIAGNOSIS — I10 Essential (primary) hypertension: Secondary | ICD-10-CM | POA: Insufficient documentation

## 2013-06-14 DIAGNOSIS — Z8719 Personal history of other diseases of the digestive system: Secondary | ICD-10-CM | POA: Insufficient documentation

## 2013-06-14 DIAGNOSIS — J45909 Unspecified asthma, uncomplicated: Secondary | ICD-10-CM | POA: Insufficient documentation

## 2013-06-14 HISTORY — DX: Cardiac arrhythmia, unspecified: I49.9

## 2013-06-14 HISTORY — DX: Atherosclerotic heart disease of native coronary artery without angina pectoris: I25.10

## 2013-06-14 HISTORY — DX: Gastric ulcer, unspecified as acute or chronic, without hemorrhage or perforation: K25.9

## 2013-06-14 NOTE — ED Notes (Signed)
Patient states that he was out working in his yard and he a flash of bright yellow light in his right eye. He denies pain now and during this episode. His eye doctor told him almost 2 years ago that if this ever happened that he needs to go the emergency department because it could cause permanent loss of vision.

## 2013-06-14 NOTE — ED Provider Notes (Signed)
CSN: [id number]     Arrival date & time 06/14/13  2155 History   First MD Initiated Contact with Patient 06/14/13 2237     Chief Complaint  Patient presents with  . Eye Problem     (Consider location/radiation/quality/duration/timing/severity/associated sxs/prior Treatment) The history is provided by the patient and medical records. No language interpreter was used.    Numa Schroeter is a 58 y.o. male  with a hx of HTN, asthma, CAD, PUD presents to the Emergency Department complaining of gradual, persistent, progressively worsening flashing light in the right eye onset 1pm today.  Pt reports no hx of eye problems but does wear glasses.  Dr. Shea Evans is opthalmology.  No associated symptoms.  Pt reports the flashes are intermittent and like a "lighthening bolt."  Pt is unsure how often this is happening.  No aggravating or alleviating factors.  Pt denies fever, chills, headache, neck pain, chest pain, SOB, abd pain, N/V/D, weakness, dizziness.  Pt denies heavy lifting or strenuous work today.  Pt smokes 0.5 packs per day, occasional EtOH, denies street drugs.       Past Medical History  Diagnosis Date  . Hypertension   . Asthma   . Coronary artery disease   . Irregular heart beat   . Stomach ulcer    Past Surgical History  Procedure Laterality Date  . Cardiovascular stress test    . Appendectomy     History reviewed. No pertinent family history. History  Substance Use Topics  . Smoking status: Current Every Day Smoker -- 0.50 packs/day    Types: Cigarettes  . Smokeless tobacco: Not on file  . Alcohol Use: 1.2 oz/week    1 Cans of beer, 1 Shots of liquor per week    Review of Systems  Constitutional: Negative for fever, diaphoresis, appetite change, fatigue and unexpected weight change.  HENT: Negative for mouth sores.   Eyes: Positive for visual disturbance. Negative for photophobia, pain, discharge, redness and itching.  Respiratory: Negative for cough, chest tightness, shortness of  breath and wheezing.   Cardiovascular: Negative for chest pain.  Gastrointestinal: Negative for nausea, vomiting, abdominal pain, diarrhea and constipation.  Endocrine: Negative for polydipsia, polyphagia and polyuria.  Genitourinary: Negative for dysuria, urgency, frequency and hematuria.  Musculoskeletal: Negative for back pain and neck stiffness.  Skin: Negative for rash.  Allergic/Immunologic: Negative for immunocompromised state.  Neurological: Negative for syncope, light-headedness and headaches.  Hematological: Does not bruise/bleed easily.  Psychiatric/Behavioral: Negative for sleep disturbance. The patient is not nervous/anxious.       Allergies  Codeine  Home Medications   Prior to Admission medications   Medication Sig Start Date End Date Taking? Authorizing Provider  aspirin 81 MG tablet Take 81 mg by mouth daily.   Yes Historical Provider, MD  clopidogrel (PLAVIX) 75 MG tablet Take 75 mg by mouth daily.   Yes Historical Provider, MD  lisinopril (PRINIVIL,ZESTRIL) 10 MG tablet Take 10 mg by mouth daily.   Yes Historical Provider, MD  Multiple Vitamin (MULTIVITAMIN WITH MINERALS) TABS tablet Take 1 tablet by mouth daily.   Yes Historical Provider, MD  pantoprazole (PROTONIX) 40 MG tablet Take 40 mg by mouth daily.   Yes Historical Provider, MD   BP 131/84  Pulse 82  Temp(Src) 99 F (37.2 C) (Oral)  Resp 14  SpO2 97% Physical Exam  Nursing note and vitals reviewed. Constitutional: He appears well-developed and well-nourished. No distress.  Awake, alert, nontoxic appearance  HENT:  Head: Normocephalic and atraumatic.  Right Ear: External ear normal.  Left Ear: External ear normal.  Nose: Nose normal.  Mouth/Throat: Oropharynx is clear and moist. No oropharyngeal exudate.  Eyes: Conjunctivae, EOM and lids are normal. Pupils are equal, round, and reactive to light. Right eye exhibits no chemosis, no discharge, no exudate and no hordeolum. No foreign body present in  the right eye. Left eye exhibits no chemosis, no discharge, no exudate and no hordeolum. No foreign body present in the left eye. Right conjunctiva is not injected. Right conjunctiva has no hemorrhage. Left conjunctiva is not injected. Left conjunctiva has no hemorrhage. No scleral icterus. Right eye exhibits normal extraocular motion and no nystagmus. Left eye exhibits normal extraocular motion and no nystagmus.  Fundoscopic exam:      The right eye shows no arteriolar narrowing, no AV nicking, no exudate, no hemorrhage and no papilledema.       The left eye shows no arteriolar narrowing, no AV nicking, no exudate, no hemorrhage and no papilledema.  Slit lamp exam:      The right eye shows no corneal abrasion, no corneal flare, no corneal ulcer, no foreign body, no fluorescein uptake and no anterior chamber bulge.       The left eye shows no corneal abrasion, no corneal flare, no corneal ulcer, no foreign body, no fluorescein uptake and no anterior chamber bulge.  Full extraocular movements without diplopia or pain Peripheral fields intact without deficit No nystagmus  Visual Acuity - Bilateral Distance: 20/30 ; R Distance: 20/40 ; L Distance: 20/40 (Pt reports vision was clearer)  Neck: Normal range of motion. Neck supple.  Cardiovascular: Normal rate, regular rhythm, normal heart sounds and intact distal pulses.   No murmur heard. Pulmonary/Chest: Effort normal and breath sounds normal. No respiratory distress. He has no wheezes.  Abdominal: There is no rebound.  Musculoskeletal: Normal range of motion. He exhibits no edema.  Neurological: He is alert.  Mental Status:  Alert, oriented, thought content appropriate. Speech fluent without evidence of aphasia. Able to follow 2 step commands without difficulty.  Cranial Nerves:  II:  Peripheral visual fields grossly normal, pupils equal, round, reactive to light III,IV, VI: ptosis not present, extra-ocular motions intact bilaterally  V,VII:  smile symmetric, facial light touch sensation equal VIII: hearing grossly normal bilaterally  IX,X: gag reflex present  XI: bilateral shoulder shrug equal and strong XII: midline tongue extension    Skin: Skin is warm and dry. He is not diaphoretic.  Psychiatric: He has a normal mood and affect.    ED Course  Procedures (including critical care time) Labs Review Labs Reviewed - No data to display  Imaging Review No results found.   EKG Interpretation None      EMERGENCY DEPARTMENT [place] EYE INTERPRETATION "Study: Limited Ultrasound of the noted body part in comments below"  INDICATIONS: Flashes of light in the right high Multiple views of the body part are obtained with a multi-frequency linear probe  PERFORMED BY:  Myself  IMAGES ARCHIVED?: Yes  SIDE:Right   BODY PART: Right globe  FINDINGS: Other Questionable retinal detachment  LIMITATIONS:  Difficulty visualizing complete retina  INTERPRETATION:  Questionable retinal detachment  COMMENT:  Unable to visualize entire retina question possible detachment   MDM   Final diagnoses:  None   Ladarious Schriever presents with findings would flashes in his right eye since 1 PM today.  Visual acuity within normal limits and no peripheral field deficits. Ultrasound used, but unable to visualize the entire retina  however I question possible retinal detachment on ultrasound.  12:54AM Discussed with Dr. Vonna KotykBevis who will see the patient in his office tonight. Patient has been given instructions to proceed directly to his office.  I have personally reviewed patient's vitals, nursing note and any pertinent labs or imaging.  I performed an undressed physical exam.    At this time, the possibility of further emergency conditions for the right eye will be evaluated by Dr. Vonna KotykBevis. The patient/guardian have been advised of the diagnosis and plan. I reviewed all labs and imaging including any potential incidental findings.   Vital signs are  stable at discharge.   BP 131/84  Pulse 82  Temp(Src) 99 F (37.2 C) (Oral)  Resp 14  SpO2 97%        Dierdre ForthHannah Savon Bordonaro, PA-C 06/16/13 0025

## 2013-06-16 NOTE — ED Provider Notes (Signed)
Medical screening examination/treatment/procedure(s) were performed by non-physician practitioner and as supervising physician I was immediately available for consultation/collaboration.   EKG Interpretation None        Donyetta Ogletree, MD 06/16/13 0706 

## 2013-07-29 ENCOUNTER — Encounter (HOSPITAL_COMMUNITY): Payer: Self-pay | Admitting: Emergency Medicine

## 2013-07-29 ENCOUNTER — Emergency Department (HOSPITAL_COMMUNITY): Payer: Medicaid Other

## 2013-07-29 ENCOUNTER — Emergency Department (HOSPITAL_COMMUNITY)
Admission: EM | Admit: 2013-07-29 | Discharge: 2013-07-29 | Disposition: A | Discharge status: Home / Self Care | Payer: Medicaid Other | Attending: Emergency Medicine | Admitting: Emergency Medicine

## 2013-07-29 DIAGNOSIS — Z8669 Personal history of other diseases of the nervous system and sense organs: Secondary | ICD-10-CM | POA: Insufficient documentation

## 2013-07-29 DIAGNOSIS — Z79899 Other long term (current) drug therapy: Secondary | ICD-10-CM | POA: Diagnosis not present

## 2013-07-29 DIAGNOSIS — IMO0002 Reserved for concepts with insufficient information to code with codable children: Secondary | ICD-10-CM | POA: Diagnosis not present

## 2013-07-29 DIAGNOSIS — Z7982 Long term (current) use of aspirin: Secondary | ICD-10-CM | POA: Diagnosis not present

## 2013-07-29 DIAGNOSIS — J45909 Unspecified asthma, uncomplicated: Secondary | ICD-10-CM | POA: Diagnosis not present

## 2013-07-29 DIAGNOSIS — F17200 Nicotine dependence, unspecified, uncomplicated: Secondary | ICD-10-CM | POA: Insufficient documentation

## 2013-07-29 DIAGNOSIS — R5383 Other fatigue: Secondary | ICD-10-CM | POA: Diagnosis not present

## 2013-07-29 DIAGNOSIS — R110 Nausea: Secondary | ICD-10-CM | POA: Insufficient documentation

## 2013-07-29 DIAGNOSIS — R5381 Other malaise: Secondary | ICD-10-CM | POA: Diagnosis not present

## 2013-07-29 DIAGNOSIS — I2510 Atherosclerotic heart disease of native coronary artery without angina pectoris: Secondary | ICD-10-CM | POA: Diagnosis not present

## 2013-07-29 DIAGNOSIS — I10 Essential (primary) hypertension: Secondary | ICD-10-CM | POA: Diagnosis not present

## 2013-07-29 DIAGNOSIS — Z8719 Personal history of other diseases of the digestive system: Secondary | ICD-10-CM | POA: Diagnosis not present

## 2013-07-29 DIAGNOSIS — Z7902 Long term (current) use of antithrombotics/antiplatelets: Secondary | ICD-10-CM | POA: Insufficient documentation

## 2013-07-29 DIAGNOSIS — R42 Dizziness and giddiness: Secondary | ICD-10-CM | POA: Diagnosis present

## 2013-07-29 HISTORY — DX: Serous retinal detachment, unspecified eye: H33.20

## 2013-07-29 LAB — COMPREHENSIVE METABOLIC PANEL
ALT: 13 U/L (ref 0–53)
AST: 29 U/L (ref 0–37)
Albumin: 4 g/dL (ref 3.5–5.2)
Alkaline Phosphatase: 45 U/L (ref 39–117)
Anion gap: 13 (ref 5–15)
BUN: 18 mg/dL (ref 6–23)
CO2: 22 mEq/L (ref 19–32)
Calcium: 9.8 mg/dL (ref 8.4–10.5)
Chloride: 101 mEq/L (ref 96–112)
Creatinine, Ser: 0.88 mg/dL (ref 0.50–1.35)
GFR calc Af Amer: >90 mL/min (ref >90)
GFR calc non Af Amer: >90 mL/min (ref >90)
Glucose, Bld: 95 mg/dL (ref 70–99)
Potassium: 5.4 mEq/L — ABNORMAL HIGH (ref 3.7–5.3)
Sodium: 136 mEq/L — ABNORMAL LOW (ref 137–147)
Total Bilirubin: 0.5 mg/dL (ref 0.3–1.2)
Total Protein: 7.8 g/dL (ref 6.0–8.3)

## 2013-07-29 LAB — CBC WITH DIFFERENTIAL/PLATELET
Basophils Absolute: 0 10*3/uL (ref 0.0–0.1)
Basophils Relative: 0 % (ref 0–1)
Eosinophils Absolute: 0.2 10*3/uL (ref 0.0–0.7)
Eosinophils Relative: 3 % (ref 0–5)
HCT: 42.3 % (ref 39.0–52.0)
Hemoglobin: 14 g/dL (ref 13.0–17.0)
Lymphocytes Relative: 25 % (ref 12–46)
Lymphs Abs: 1.6 10*3/uL (ref 0.7–4.0)
MCH: 30 pg (ref 26.0–34.0)
MCHC: 33.1 g/dL (ref 30.0–36.0)
MCV: 90.6 fL (ref 78.0–100.0)
Monocytes Absolute: 0.5 10*3/uL (ref 0.1–1.0)
Monocytes Relative: 8 % (ref 3–12)
Neutro Abs: 4.2 10*3/uL (ref 1.7–7.7)
Neutrophils Relative %: 64 % (ref 43–77)
Platelets: 253 10*3/uL (ref 150–400)
RBC: 4.67 MIL/uL (ref 4.22–5.81)
RDW: 13 % (ref 11.5–15.5)
WBC: 6.5 10*3/uL (ref 4.0–10.5)

## 2013-07-29 MED ORDER — ONDANSETRON 8 MG PO TBDP: 8.0000 mg | ORAL_TABLET | Freq: Three times a day (TID) | ORAL | Status: DC | PRN

## 2013-07-29 MED ORDER — MECLIZINE HCL 25 MG PO TABS
25.0000 mg | ORAL_TABLET | Freq: Once | ORAL | Status: AC
  Administered 2013-07-29: 25 mg via ORAL
  Filled 2013-07-29: qty 1

## 2013-07-29 MED ORDER — MECLIZINE HCL 25 MG PO TABS: 25.0000 mg | ORAL_TABLET | Freq: Four times a day (QID) | ORAL | Status: DC

## 2013-07-29 NOTE — ED Notes (Signed)
Initial Contact - Pt ret from MRI at this time, ambulatory with steady gait to void in BR.  Pt continues to c/o dizziness.  Denies other needs/complaints.  Neuros grossly intact.  Skin PWD.  A+Ox4.  MAEI.  NAD.

## 2013-07-29 NOTE — ED Provider Notes (Signed)
CSN: [id number]     Arrival date & time 07/29/13  9562 History   First MD Initiated Contact with Patient 07/29/13 1030     Chief Complaint  Patient presents with  . Weakness  . Dizziness  . Nausea     (Consider location/radiation/quality/duration/timing/severity/associated sxs/prior Treatment) HPI  58 year old male comes in today complaining of onset of vertigo this morning after awakening. He states he attempted to move in bed and felt very woozy and nauseated. He did not vomit. He has had repair of a recent retinal detachment last Saturday. He was told not to do things like ache that increases intracranial pressure and is very concerned about his right eye with the surgery is done. He has had no change in the amount of vision. Vision is slowly coming back and back appear somewhat improved today. He has some diffuse pain around that area denies it is a change from prior. He denies any lateralized changes such as weakness in his right hand or left hand. He has been ambulatory but has worsening of his vertigo with this.  Past Medical History  Diagnosis Date  . Hypertension   . Asthma   . Coronary artery disease   . Irregular heart beat   . Stomach ulcer   . Retinal detachment     right eye July 2014   Past Surgical History  Procedure Laterality Date  . Cardiovascular stress test    . Appendectomy     History reviewed. No pertinent family history. History  Substance Use Topics  . Smoking status: Current Every Day Smoker -- 0.50 packs/day    Types: Cigarettes  . Smokeless tobacco: Not on file  . Alcohol Use: 1.2 oz/week    1 Cans of beer, 1 Shots of liquor per week     Comment: daily     Review of Systems  All other systems reviewed and are negative.     Allergies  Codeine  Home Medications   Prior to Admission medications   Medication Sig Start Date End Date Taking? Authorizing Provider  ARTIFICIAL TEAR OINTMENT OP Apply 1 strip to eye daily as needed (for  dryness).   Yes Historical Provider, MD  aspirin 81 MG tablet Take 81 mg by mouth daily.   Yes Historical Provider, MD  clopidogrel (PLAVIX) 75 MG tablet Take 75 mg by mouth daily.   Yes Historical Provider, MD  lisinopril (PRINIVIL,ZESTRIL) 10 MG tablet Take 10 mg by mouth daily.   Yes Historical Provider, MD  Multiple Vitamin (MULTIVITAMIN WITH MINERALS) TABS tablet Take 1 tablet by mouth daily.   Yes Historical Provider, MD  pantoprazole (PROTONIX) 40 MG tablet Take 40 mg by mouth daily.   Yes Historical Provider, MD  prednisoLONE acetate (PRED FORTE) 1 % ophthalmic suspension Place 1 drop into the right eye 3 (three) times daily.   Yes Historical Provider, MD   BP 136/72  Pulse 54  Temp(Src) 97.7 F (36.5 C) (Oral)  Resp 16  SpO2 100% Physical Exam  Nursing note and vitals reviewed. Constitutional: He is oriented to person, place, and time. He appears well-developed and well-nourished.  HENT:  Head: Normocephalic and atraumatic.  Right Ear: External ear normal.  Left Ear: External ear normal.  Eyes: Conjunctivae and EOM are normal.  Unable to visualize right fundus of the eye left eye exam shows normal funduscopic exam  Neck: Normal range of motion. Neck supple.  Cardiovascular: Normal rate, regular rhythm, normal heart sounds and intact distal pulses.   Pulmonary/Chest:  Effort normal and breath sounds normal.  Abdominal: Soft. Bowel sounds are normal.  Musculoskeletal: Normal range of motion.  Neurological: He is alert and oriented to person, place, and time. He has normal reflexes. He displays normal reflexes. No cranial nerve deficit. He exhibits normal muscle tone. Coordination abnormal.  Patient with over point on the finger to nose bilaterally  Skin: Skin is warm and dry.  Psychiatric: He has a normal mood and affect. His behavior is normal. Judgment and thought content normal.    ED Course  Procedures (including critical care time) Labs Review Labs Reviewed   COMPREHENSIVE METABOLIC PANEL - Abnormal; Notable for the following:    Sodium 136 (*)    Potassium 5.4 (*)    All other components within normal limits  CBC WITH DIFFERENTIAL    Imaging Review Mr Brain Wo Contrast  07/29/2013   CLINICAL DATA:  Nausea vomiting and weakness.  EXAM: MRI HEAD WITHOUT CONTRAST  TECHNIQUE: Multiplanar, multiecho pulse sequences of the brain and surrounding structures were obtained without intravenous contrast.  COMPARISON:  CT head 02/15/2013  FINDINGS: Ventricle size is normal. Cerebral volume normal for age. Craniocervical junction is normal. Pituitary is normal in size.  Negative for acute infarct.  Several small white matter hyperintensities bilaterally, most consistent with chronic microvascular ischemia  Negative for hemorrhage or mass. No shift of the midline structures.  Mild mucosal edema in the paranasal sinuses.  IMPRESSION: Mild chronic microvascular ischemia.  No acute infarct.   Electronically Signed   By: Marlan Palauharles  Clark M.D.   On: 07/29/2013 12:24     EKG Interpretation None      MDM   Final diagnoses:  Vertigo    58 year old male who presents with vertigo and recent right eye surgery. I feel that he has normal upper voiding likely secondary to his eye surgery, however MRI was obtained to rule out stroke and is negative for stroke.  Eyes unable to visualize fundus on right eye and is unclear what vision he has had since surgery. I [facility]. He will see him in followup today. I have discussed this with the patient and he understands appear to go directly to the eye Center. His vertigo is being treated here with Antivert and he is given a prescription for Zofran. He is able to ambulate without difficulty.    Hilario Quarryanielle S Araya Roel, MD 08/01/13 1249

## 2013-07-29 NOTE — Discharge Instructions (Signed)
[facility]  They have you on the schedule this afternoon per Dr. Joelene Millinliver.    Vertigo Vertigo means you feel like you are moving when you are not. Vertigo can make you feel like things around you are moving when they are not. This problem often goes away on its own.  HOME CARE   Follow your doctor's instructions.  Avoid driving.  Avoid using heavy machinery.  Avoid doing any activity that could be dangerous if you have a vertigo attack.  Tell your doctor if a medicine seems to cause your vertigo. GET HELP RIGHT AWAY IF:   Your medicines do not help or make you feel worse.  You have trouble talking or walking.  You feel weak or have trouble using your arms, hands, or legs.  You have bad headaches.  You keep feeling sick to your stomach (nauseous) or throwing up (vomiting).  Your vision changes.  A family member notices changes in your behavior.  Your problems get worse. MAKE SURE YOU:  Understand these instructions.  Will watch your condition.  Will get help right away if you are not doing well or get worse. Document Released: 10/02/2007 Document Revised: 03/17/2011 Document Reviewed: 07/11/2010 [facility] Patient Information 2015 [employer] [place]. This information is not intended to replace advice given to you by your health care provider. Make sure you discuss any questions you have with your health care provider.

## 2013-07-29 NOTE — ED Notes (Signed)
MD at bedside. 

## 2013-07-29 NOTE — ED Notes (Signed)
Patient transported to MRI 

## 2013-07-29 NOTE — ED Notes (Signed)
Pt in MRI at this time 

## 2013-07-29 NOTE — ED Notes (Signed)
Pt reports he had  Right eye retinal detachment on 7/18, pt reports today starting 0530 nausea, weakness, and dizziness. Denies pain. Reports feels like his head is spinning.

## 2013-10-21 ENCOUNTER — Encounter (HOSPITAL_COMMUNITY): Payer: Self-pay | Admitting: Emergency Medicine

## 2013-10-21 ENCOUNTER — Emergency Department (HOSPITAL_COMMUNITY)
Admission: EM | Admit: 2013-10-21 | Discharge: 2013-10-21 | Disposition: A | Discharge status: Home / Self Care | Payer: Medicaid Other | Source: Home / Self Care | Attending: Family Medicine | Admitting: Family Medicine

## 2013-10-21 DIAGNOSIS — M25562 Pain in left knee: Secondary | ICD-10-CM

## 2013-10-21 MED ORDER — OXYCODONE-ACETAMINOPHEN 5-325 MG PO TABS: 1.0000 | ORAL_TABLET | ORAL | Status: DC | PRN

## 2013-10-21 MED ORDER — METHYLPREDNISOLONE ACETATE 40 MG/ML IJ SUSP
INTRAMUSCULAR | Status: AC
  Filled 2013-10-21: qty 5

## 2013-10-21 MED ORDER — LIDOCAINE HCL (PF) 2 % IJ SOLN
INTRAMUSCULAR | Status: AC
  Filled 2013-10-21: qty 2

## 2013-10-21 MED ORDER — METHYLPREDNISOLONE ACETATE 40 MG/ML IJ SUSP
40.0000 mg | Freq: Once | INTRAMUSCULAR | Status: AC
  Administered 2013-10-21: 40 mg via INTRA_ARTICULAR

## 2013-10-21 NOTE — ED Notes (Signed)
Pt   Reports  Pain  l  Knee   For  About  1  Week   He  denys  Any  Recent  Injury     He  Reports    A  History    Of   Arthritis  In  Past

## 2013-10-21 NOTE — Discharge Instructions (Signed)

## 2013-10-21 NOTE — ED Provider Notes (Signed)
Medical screening examination/treatment/procedure(s) were performed by resident physician or non-physician practitioner and as supervising physician I was immediately available for consultation/collaboration.   Bliss Tsang DOUGLAS MD.   Dameshia Seybold D Loden Laurent, MD 10/21/13 1652 

## 2013-10-21 NOTE — ED Provider Notes (Signed)
CSN: [id number]     Arrival date & time 10/21/13  1448 History   First MD Initiated Contact with Patient 10/21/13 1555     Chief Complaint  Patient presents with  . Knee Pain   (Consider location/radiation/quality/duration/timing/severity/associated sxs/prior Treatment) HPI     58 year old male presents complaining of knee pain. He has a history of chronic knee pain with occasional flares. He does not normally take any medication for this but when it flares that he will usually get an injection into his knee and he usually is prescribe a few tablets of a pain medicine. He tried to make an appointment with his primary care doctor but they would not be able to see him until January. He denies any specific injury. He is not currently under the care of orthopedic surgeon. No numbness or weakness, no swelling. He also complains of stomach pain after taking ibuprofen, but no stomach pain when he has not taken any ibuprofen. No nausea or vomiting  Past Medical History  Diagnosis Date  . Hypertension   . Asthma   . Coronary artery disease   . Irregular heart beat   . Stomach ulcer   . Retinal detachment     right eye July 2014   Past Surgical History  Procedure Laterality Date  . Cardiovascular stress test    . Appendectomy     History reviewed. No pertinent family history. History  Substance Use Topics  . Smoking status: Current Every Day Smoker -- 0.50 packs/day    Types: Cigarettes  . Smokeless tobacco: Not on file  . Alcohol Use: 1.2 oz/week    1 Cans of beer, 1 Shots of liquor per week     Comment: daily     Review of Systems  Musculoskeletal: Positive for arthralgias (left knee pain).  All other systems reviewed and are negative.   Allergies  Codeine  Home Medications   Prior to Admission medications   Medication Sig Start Date End Date Taking? Authorizing Provider  ARTIFICIAL TEAR OINTMENT OP Apply 1 strip to eye daily as needed (for dryness).    Historical Provider,  MD  aspirin 81 MG tablet Take 81 mg by mouth daily.    Historical Provider, MD  clopidogrel (PLAVIX) 75 MG tablet Take 75 mg by mouth daily.    Historical Provider, MD  lisinopril (PRINIVIL,ZESTRIL) 10 MG tablet Take 10 mg by mouth daily.    Historical Provider, MD  meclizine (ANTIVERT) 25 MG tablet Take 1 tablet (25 mg total) by mouth 4 (four) times daily. 07/29/13   Hilario Quarryanielle S Ray, MD  Multiple Vitamin (MULTIVITAMIN WITH MINERALS) TABS tablet Take 1 tablet by mouth daily.    Historical Provider, MD  ondansetron (ZOFRAN ODT) 8 MG disintegrating tablet Take 1 tablet (8 mg total) by mouth every 8 (eight) hours as needed for nausea or vomiting. 07/29/13   Hilario Quarryanielle S Ray, MD  oxyCODONE-acetaminophen (PERCOCET/ROXICET) 5-325 MG per tablet Take 1 tablet by mouth every 4 (four) hours as needed for moderate pain or severe pain. 10/21/13   Adrian BlackwaterZachary H Chief Walkup, PA-C  pantoprazole (PROTONIX) 40 MG tablet Take 40 mg by mouth daily.    Historical Provider, MD  prednisoLONE acetate (PRED FORTE) 1 % ophthalmic suspension Place 1 drop into the right eye 3 (three) times daily.    Historical Provider, MD   BP 148/88  Pulse 66  Temp(Src) 97.9 F (36.6 C) (Oral)  Resp 20  SpO2 98% Physical Exam  Nursing note and vitals reviewed. Constitutional:  He is oriented to person, place, and time. He appears well-developed and well-nourished. No distress.  HENT:  Head: Normocephalic.  Cardiovascular:  Pulses:      Dorsalis pedis pulses are 2+ on the left side.  Pulmonary/Chest: Effort normal. No respiratory distress.  Musculoskeletal:       Right knee: Normal.       Left knee: He exhibits normal range of motion, no swelling, no effusion, no deformity, no erythema, no LCL laxity, no bony tenderness, normal meniscus and no MCL laxity. Tenderness found. Medial joint line and lateral joint line tenderness noted.  Neurological: He is alert and oriented to person, place, and time. Coordination normal.  Skin: Skin is warm and  dry. No rash noted. He is not diaphoretic.  Psychiatric: He has a normal mood and affect. Judgment normal.    ED Course  Injection of joint Date/Time: 10/21/2013 4:37 PM Performed by: Autumn MessingBAKER, Latika Kronick, H Authorized by: Bradd CanaryKINDL, JAMES D Consent: Verbal consent obtained. Risks and benefits: risks, benefits and alternatives were discussed Consent given by: patient Patient identity confirmed: verbally with patient Time out: Immediately prior to procedure a "time out" was called to verify the correct patient, procedure, equipment, support staff and site/side marked as required. Preparation: Patient was prepped and draped in the usual sterile fashion. Local anesthesia used: yes Patient sedated: no Comments: Injection into knee of 40 mg depomedrol with 3 ml 2% lidocaine    (including critical care time) Labs Review Labs Reviewed - No data to display  Imaging Review No results found.   MDM   1. Left knee pain    Injection performed, pain somewhat relieved at work. Will prescribe a small quantity of oxycodone for pain. Followup with primary care. Avoid NSAIDs due to stomach pain after taking them.  Meds ordered this encounter  Medications  . methylPREDNISolone acetate (DEPO-MEDROL) injection 40 mg    Sig:   . oxyCODONE-acetaminophen (PERCOCET/ROXICET) 5-325 MG per tablet    Sig: Take 1 tablet by mouth every 4 (four) hours as needed for moderate pain or severe pain.    Dispense:  10 tablet    Refill:  0    Order Specific Question:  Supervising Provider    Answer:  Bradd CanaryKINDL, JAMES D [[id number]]     Graylon GoodZachary H Reveca Desmarais, PA-C 10/21/13 [phone]

## 2013-11-27 ENCOUNTER — Emergency Department (HOSPITAL_COMMUNITY): Payer: Medicaid Other

## 2013-11-27 ENCOUNTER — Emergency Department (HOSPITAL_COMMUNITY)
Admission: EM | Admit: 2013-11-27 | Discharge: 2013-11-28 | Disposition: A | Discharge status: Home / Self Care | Payer: Medicaid Other | Attending: Emergency Medicine | Admitting: Emergency Medicine

## 2013-11-27 ENCOUNTER — Encounter (HOSPITAL_COMMUNITY): Payer: Self-pay | Admitting: Emergency Medicine

## 2013-11-27 DIAGNOSIS — Z8669 Personal history of other diseases of the nervous system and sense organs: Secondary | ICD-10-CM | POA: Insufficient documentation

## 2013-11-27 DIAGNOSIS — M1990 Unspecified osteoarthritis, unspecified site: Secondary | ICD-10-CM | POA: Diagnosis not present

## 2013-11-27 DIAGNOSIS — M7502 Adhesive capsulitis of left shoulder: Secondary | ICD-10-CM | POA: Diagnosis not present

## 2013-11-27 DIAGNOSIS — Z791 Long term (current) use of non-steroidal anti-inflammatories (NSAID): Secondary | ICD-10-CM | POA: Diagnosis not present

## 2013-11-27 DIAGNOSIS — Z7982 Long term (current) use of aspirin: Secondary | ICD-10-CM | POA: Diagnosis not present

## 2013-11-27 DIAGNOSIS — Z7952 Long term (current) use of systemic steroids: Secondary | ICD-10-CM | POA: Diagnosis not present

## 2013-11-27 DIAGNOSIS — J45909 Unspecified asthma, uncomplicated: Secondary | ICD-10-CM | POA: Diagnosis not present

## 2013-11-27 DIAGNOSIS — I2510 Atherosclerotic heart disease of native coronary artery without angina pectoris: Secondary | ICD-10-CM | POA: Diagnosis not present

## 2013-11-27 DIAGNOSIS — Z7902 Long term (current) use of antithrombotics/antiplatelets: Secondary | ICD-10-CM | POA: Diagnosis not present

## 2013-11-27 DIAGNOSIS — I10 Essential (primary) hypertension: Secondary | ICD-10-CM | POA: Diagnosis not present

## 2013-11-27 DIAGNOSIS — Z79899 Other long term (current) drug therapy: Secondary | ICD-10-CM | POA: Diagnosis not present

## 2013-11-27 DIAGNOSIS — M25512 Pain in left shoulder: Secondary | ICD-10-CM | POA: Diagnosis present

## 2013-11-27 DIAGNOSIS — Z720 Tobacco use: Secondary | ICD-10-CM | POA: Diagnosis not present

## 2013-11-27 DIAGNOSIS — R52 Pain, unspecified: Secondary | ICD-10-CM

## 2013-11-27 DIAGNOSIS — Z8719 Personal history of other diseases of the digestive system: Secondary | ICD-10-CM | POA: Insufficient documentation

## 2013-11-27 HISTORY — DX: Unspecified osteoarthritis, unspecified site: M19.90

## 2013-11-27 HISTORY — DX: Bursopathy, unspecified: M71.9

## 2013-11-27 HISTORY — DX: Gout, unspecified: M10.9

## 2013-11-27 NOTE — ED Provider Notes (Signed)
CSN: [id number]     Arrival date & time 11/27/13  2115 History  This chart was scribed for non-physician practitioner, Antony MaduraKelly Sanda Dejoy, PA-C working with Loren Raceravid Yelverton, MD by Greggory StallionKayla Andersen, ED scribe. This patient was seen in room WTR7/WTR7 and the patient's care was started at 11:44 PM.   Chief Complaint  Patient presents with  . Shoulder Pain   The history is provided by the patient. No language interpreter was used.    HPI Comments: Danny Conner with history of arthritis who presents to the Emergency Department complaining of throbbing, aching left shoulder pain that started 2 days ago. Pain does not radiate. He thinks the pain is due to arthritis but states it has never been in his shoulder. States he is left hand dominant and was raking before the pain started. Movements worsen pain. States he has rested his shoulder and not moved it over the last few days. Denies loss of sensation in arm or hand.  PCP is Dr. Larene PickettAbvuree  Past Medical History  Diagnosis Date  . Hypertension   . Asthma   . Coronary artery disease   . Irregular heart beat   . Stomach ulcer   . Retinal detachment     right eye July 2014  . Arthritis   . Bursitis   . Gout    Past Surgical History  Procedure Laterality Date  . Cardiovascular stress test    . Appendectomy     No family history on file. History  Substance Use Topics  . Smoking status: Current Every Day Smoker -- 0.50 packs/day    Types: Cigarettes  . Smokeless tobacco: Not on file  . Alcohol Use: 1.2 oz/week    1 Cans of beer, 1 Shots of liquor per week     Comment: daily     Review of Systems  Musculoskeletal: Positive for arthralgias.  Neurological: Negative for numbness.  All other systems reviewed and are negative.  Allergies  Codeine  Home Medications   Prior to Admission medications   Medication Sig Start Date End Date Taking? Authorizing Provider  ARTIFICIAL TEAR OINTMENT OP Apply 1 strip to eye daily as needed  (for dryness).    Historical Provider, MD  aspirin 81 MG tablet Take 81 mg by mouth daily.    Historical Provider, MD  clopidogrel (PLAVIX) 75 MG tablet Take 75 mg by mouth daily.    Historical Provider, MD  diazepam (VALIUM) 5 MG tablet Take 1 tablet (5 mg total) by mouth 2 (two) times daily. 11/28/13   Antony MaduraKelly Tylah Mancillas, PA-C  diclofenac sodium (VOLTAREN) 1 % GEL Apply 2 g topically 4 (four) times daily. Apply to shoulder as directed 11/28/13   Antony MaduraKelly Travelle Mcclimans, PA-C  lisinopril (PRINIVIL,ZESTRIL) 10 MG tablet Take 10 mg by mouth daily.    Historical Provider, MD  meclizine (ANTIVERT) 25 MG tablet Take 1 tablet (25 mg total) by mouth 4 (four) times daily. 07/29/13   Hilario Quarryanielle S Ray, MD  Multiple Vitamin (MULTIVITAMIN WITH MINERALS) TABS tablet Take 1 tablet by mouth daily.    Historical Provider, MD  ondansetron (ZOFRAN ODT) 8 MG disintegrating tablet Take 1 tablet (8 mg total) by mouth every 8 (eight) hours as needed for nausea or vomiting. 07/29/13   Hilario Quarryanielle S Ray, MD  oxyCODONE-acetaminophen (PERCOCET/ROXICET) 5-325 MG per tablet Take 1 tablet by mouth every 4 (four) hours as needed for moderate pain or severe pain. 11/28/13   Antony MaduraKelly Kerly Rigsbee, PA-C  pantoprazole (PROTONIX) 40 MG  tablet Take 40 mg by mouth daily.    Historical Provider, MD  prednisoLONE acetate (PRED FORTE) 1 % ophthalmic suspension Place 1 drop into the right eye 3 (three) times daily.    Historical Provider, MD  predniSONE (DELTASONE) 20 MG tablet Take 2 tablets (40 mg total) by mouth daily. 11/28/13   Antony MaduraKelly Latonyia Lopata, PA-C   BP 160/98 mmHg  Pulse 61  Temp(Src) 98.1 F (36.7 C) (Oral)  Resp 16  Ht 5\' 8"  (1.727 m)  Wt 160 lb (72.576 kg)  BMI 24.33 kg/m2  SpO2 97%   Physical Exam  Constitutional: He is oriented to person, place, and time. He appears well-developed and well-nourished. No distress.  HENT:  Head: Normocephalic and atraumatic.  Eyes: Conjunctivae and EOM are normal. No scleral icterus.  Neck: Normal range of motion.   Cardiovascular: Normal rate, regular rhythm and intact distal pulses.   Distal radial pulse 2+ in left upper extremity  Pulmonary/Chest: Effort normal. No respiratory distress.  Musculoskeletal:       Left shoulder: He exhibits decreased range of motion, tenderness, pain and spasm. He exhibits no bony tenderness, no swelling, no effusion, no crepitus, no deformity, normal pulse and normal strength.       Arms: Tenderness to palpation to anterior left shoulder. There is limited range of motion especially with abduction. Appreciate this to be secondary to muscle spasm. No bony deformity, crepitus, or effusion.  Neurological: He is alert and oriented to person, place, and time. He exhibits normal muscle tone. Coordination normal.  Sensation to light touch intact. Normal grip strength.  Skin: Skin is warm and dry. No rash noted. He is not diaphoretic. No erythema. No pallor.  Psychiatric: He has a normal mood and affect. His behavior is normal.  Nursing note and vitals reviewed.   ED Course  Procedures (including critical care time)  DIAGNOSTIC STUDIES: Oxygen Saturation is 99% on RA, normal by my interpretation.    COORDINATION OF CARE: 11:50 PM-Discussed treatment plan which includes a muscle relaxer, prednisone, pain medication and stretching with pt at bedside and pt agreed to plan. Will give pt an orthopedic referral and advised him to follow up.   Labs Review Labs Reviewed - No data to display  Imaging Review Dg Shoulder Left  11/27/2013   CLINICAL DATA:  Left shoulder pain for 2 days.  EXAM: LEFT SHOULDER - 2+ VIEW  COMPARISON:  None.  FINDINGS: No acute fracture or dislocation. No erosive changes or soft tissue mineralization. No glenohumeral degenerative change.  IMPRESSION: Negative.   Electronically Signed   By: Tiburcio PeaJonathan  Watts M.D.   On: 11/27/2013 22:55     EKG Interpretation None      MDM   Final diagnoses:  Left shoulder pain  Adhesive capsulitis of left shoulder     58 year old Conner presents to the emergency department for further evaluation of left shoulder pain. Symptoms began after patient raked leaves for many hours. He states symptoms have been worsening over the past 2 days. Patient is neurovascularly intact on exam. No evidence of septic joint; no erythema or heat to touch. Patient is afebrile. Exam today does suggest adhesive capsulitis of the left shoulder. Imaging negative for fracture, dislocation, or bony deformity. Patient treated in ED with Toradol, oxycodone, and Valium. He will be discharged with orthopedic referral as well as prescriptions for diclofenac gel, prednisone, Valium, and Percocet for pain as needed. Return precautions discussed and provided. Patient agreeable to plan with no unaddressed concerns.  I  personally performed the services described in this documentation, which was scribed in my presence. The recorded information has been reviewed and is accurate.   Filed Vitals:   11/27/13 2151 11/27/13 2154 11/28/13 0013  BP: 135/80  160/98  Pulse: 68  61  Temp: 98.2 F (36.8 C)  98.1 F (36.7 C)  TempSrc: Oral  Oral  Resp: 18  16  Height:  5\' 8"  (1.727 m)   Weight:  160 lb (72.576 kg)   SpO2: 99%  97%     Antony Madura, PA-C 11/28/13 0201  Loren Racer, MD 11/28/13 [phone]

## 2013-11-27 NOTE — ED Notes (Signed)
Pt c/o pain to L shoulder x 2 days. Pt states he has gouty arthritis and bursitis. Pt states he was recently tx for gout to L knee

## 2013-11-28 MED ORDER — KETOROLAC TROMETHAMINE 60 MG/2ML IM SOLN
60.0000 mg | Freq: Once | INTRAMUSCULAR | Status: AC
  Administered 2013-11-28: 60 mg via INTRAMUSCULAR
  Filled 2013-11-28: qty 2

## 2013-11-28 MED ORDER — OXYCODONE-ACETAMINOPHEN 5-325 MG PO TABS: 1.0000 | ORAL_TABLET | ORAL | Status: DC | PRN

## 2013-11-28 MED ORDER — DICLOFENAC SODIUM 1 % TD GEL: 2.0000 g | Freq: Four times a day (QID) | TRANSDERMAL | Status: DC

## 2013-11-28 MED ORDER — DIAZEPAM 5 MG/ML IJ SOLN
3.7500 mg | Freq: Once | INTRAMUSCULAR | Status: AC
  Administered 2013-11-28: 3.75 mg via INTRAMUSCULAR
  Filled 2013-11-28: qty 2

## 2013-11-28 MED ORDER — PREDNISONE 20 MG PO TABS: 40.0000 mg | ORAL_TABLET | Freq: Every day | ORAL | Status: DC

## 2013-11-28 MED ORDER — DIAZEPAM 5 MG PO TABS: 5.0000 mg | ORAL_TABLET | Freq: Two times a day (BID) | ORAL | Status: DC

## 2013-11-28 MED ORDER — OXYCODONE-ACETAMINOPHEN 5-325 MG PO TABS
2.0000 | ORAL_TABLET | Freq: Once | ORAL | Status: AC
  Administered 2013-11-28: 2 via ORAL
  Filled 2013-11-28: qty 2

## 2013-11-28 NOTE — Discharge Instructions (Signed)
Adhesive Capsulitis °Sometimes the shoulder becomes stiff and is painful to move. Some people say it feels as if the shoulder is frozen in place. Because of this, the condition is called "frozen shoulder." Its medical name is adhesive capsulitis.  °The shoulder joint is made up of strong connective tissue that attaches the ball of the humerus to the shallow shoulder socket. This strong connective tissue is called the joint capsule. This tissue can become stiff and swollen. That is when adhesive capsulitis sets in. °CAUSES  °It is not always clear just what the cause adhesive capsulitis. Possibilities include: °· Injury to the shoulder joint. °· Strain. This is a repetitive injury brought about by overuse. °· Lack of use. Perhaps your arm or hand was otherwise injured. It might have been in a sling for awhile. Or perhaps you were not using it to avoid pain. °· Referred pain. This is a sort of trick the body plays. You feel pain in the shoulder. But, the pain actually comes from an injury somewhere else in the body. °· Long-standing health problems. Several diseases can cause adhesive capsulitis. They include diabetes, heart disease, stroke, thyroid problems, rheumatoid arthritis and lung disease. °· Being a women older than 40. Anyone can develop adhesive capsulitis but it is most common in women in this age group. °SYMPTOMS  °· Pain. °· It occurs when the arm is moved. °· Parts of the shoulder might hurt if they are touched. °· Pain is worse at night or when resting. °· Soreness. It might not be strong enough to be called pain. But, the shoulder aches. °· The shoulder does not move freely. °· Muscle spasms. °· Trouble sleeping because of shoulder ache or pain. °DIAGNOSIS  °To decide if you have adhesive capsulitis, your healthcare provider will probably: °· Ask about symptoms you have noticed. °· Ask about your history of joint pain and anything that might have caused the pain. °· Ask about your overall  health. °· Use hands to feel your shoulder and neck. °· Ask you to move your shoulder in specific directions. This may indicate the origin of the pain. °· Order imaging tests; pictures of the shoulder. They help pinpoint the source of the problem. An X-ray might be used. For more detail, an MRI is often used. An MRI details the tendons, muscles and ligaments as well as the joint. °TREATMENT  °Adhesive capsulitis can be treated several ways. Most treatments can be done in a clinic or in your healthcare provider's office. Be sure to discuss the different options with your caregiver. They include: °· Physical therapy. You will work on specific exercises to get your shoulder moving again. The exercises usually involve stretching. A physical therapist (a caregiver with special training) can show you what to do and what not to do. The exercises will need to be done daily. °· Medication. °· Over-the-counter medicines may relieve pain and inflammation (the body's way of reacting to injury or infection). °· Corticosteroids. These are stronger drugs to reduce pain and inflammation. They are given by injection (shots) into the shoulder joint. Frequent treatment is not recommended. °· Muscle relaxants. Medication may be prescribed to ease muscle spasms. °· Treatment of underlying conditions. This means treating another condition that is causing your shoulder problem. This might be a rotator cuff (tendon) problem °· Shoulder manipulation. The shoulder will be moved by your healthcare provider. You would be under general anesthesia (given a drug that puts you to sleep). You would not feel anything. Sometimes   the joint will be injected with salt water (saline) at high pressure to break down internal scarring in the joint capsule. °· Surgery. This is rarely needed. It may be suggested in advanced cases after all other treatment has failed. °PROGNOSIS  °In time, most people recover from adhesive capsulitis. Sometimes, however, the  pain goes away but full movement of the shoulder does not return.  °HOME CARE INSTRUCTIONS  °· Take any pain medications recommended by your healthcare provider. Follow the directions carefully. °· If you have physical therapy, follow through with the therapist's suggestions. Be sure you understand the exercises you will be doing. You should understand: °¨ How often the exercises should be done. °¨ How many times each exercise should be repeated. °¨ How long they should be done. °¨ What other activities you should do, or not do. °¨ That you should warm up before doing any exercise. Just 5 to 10 minutes will help. Small, gentle movements should get your shoulder ready for more. °· Avoid high-demand exercise that involves your shoulder such as throwing. This type of exercise can make pain worse. °· Consider using cold packs. Cold may ease swelling and pain. Ask your healthcare provider if a cold pack might help you. If so, get directions on how and when to use them. °SEEK MEDICAL CARE IF:  °· You have any questions about your medications. °· Your pain continues to increase. °Document Released: 10/20/2008 Document Revised: 03/17/2011 Document Reviewed: 10/20/2008 °[employer]® Patient Information ©2015 [employer]. This information is not intended to replace advice given to you by your health care provider. Make sure you discuss any questions you have with your health care provider. ° ° °Shoulder Exercises °EXERCISES  °RANGE OF MOTION (ROM) AND STRETCHING EXERCISES °These exercises may help you when beginning to rehabilitate your injury. Your symptoms may resolve with or without further involvement from your physician, physical therapist or athletic trainer. While completing these exercises, remember:  °· Restoring tissue flexibility helps normal motion to return to the joints. This allows healthier, less painful movement and activity. °· An effective stretch should be held for at least 30 seconds. °· A stretch should  never be painful. You should only feel a gentle lengthening or release in the stretched tissue. °ROM - Pendulum °· Bend at the waist so that your right / left arm falls away from your body. Support yourself with your opposite hand on a solid surface, such as a table or a countertop. °· Your right / left arm should be perpendicular to the ground. If it is not perpendicular, you need to lean over farther. Relax the muscles in your right / left arm and shoulder as much as possible. °· Gently sway your hips and trunk so they move your right / left arm without any use of your right / left shoulder muscles. °· Progress your movements so that your right / left arm moves side to side, then forward and backward, and finally, both clockwise and counterclockwise. °· Complete __________ repetitions in each direction. Many people use this exercise to relieve discomfort in their shoulder as well as to gain range of motion. °Repeat __________ times. Complete this exercise __________ times per day. °STRETCH - Flexion, Standing °· Stand with good posture. With an underhand grip on your right / left hand and an overhand grip on the opposite hand, grasp a broomstick or cane so that your hands are a little more than shoulder-width apart. °· Keeping your right / left elbow straight and shoulder   muscles relaxed, push the stick with your opposite hand to raise your right / left arm in front of your body and then overhead. Raise your arm until you feel a stretch in your right / left shoulder, but before you have increased shoulder pain. °· Try to avoid shrugging your right / left shoulder as your arm rises by keeping your shoulder blade tucked down and toward your mid-back spine. Hold __________ seconds. °· Slowly return to the starting position. °Repeat __________ times. Complete this exercise __________ times per day. °STRETCH - Internal Rotation °· Place your right / left hand behind your back, palm-up. °· Throw a towel or belt over  your opposite shoulder. Grasp the towel/belt with your right / left hand. °· While keeping an upright posture, gently pull up on the towel/belt until you feel a stretch in the front of your right / left shoulder. °· Avoid shrugging your right / left shoulder as your arm rises by keeping your shoulder blade tucked down and toward your mid-back spine. °· Hold __________. Release the stretch by lowering your opposite hand. °Repeat __________ times. Complete this exercise __________ times per day. °STRETCH - External Rotation and Abduction °· Stagger your stance through a doorframe. It does not matter which foot is forward. °· As instructed by your physician, physical therapist or athletic trainer, place your hands: °¨ And forearms above your head and on the door frame. °¨ And forearms at head-height and on the door frame. °¨ At elbow-height and on the door frame. °· Keeping your head and chest upright and your stomach muscles tight to prevent over-extending your low-back, slowly shift your weight onto your front foot until you feel a stretch across your chest and/or in the front of your shoulders. °· Hold __________ seconds. Shift your weight to your back foot to release the stretch. °Repeat __________ times. Complete this stretch __________ times per day.  °STRENGTHENING EXERCISES  °These exercises may help you when beginning to rehabilitate your injury. They may resolve your symptoms with or without further involvement from your physician, physical therapist or athletic trainer. While completing these exercises, remember:  °· Muscles can gain both the endurance and the strength needed for everyday activities through controlled exercises. °· Complete these exercises as instructed by your physician, physical therapist or athletic trainer. Progress the resistance and repetitions only as guided. °· You may experience muscle soreness or fatigue, but the pain or discomfort you are trying to eliminate should never worsen  during these exercises. If this pain does worsen, stop and make certain you are following the directions exactly. If the pain is still present after adjustments, discontinue the exercise until you can discuss the trouble with your clinician. °· If advised by your physician, during your recovery, avoid activity or exercises which involve actions that place your right / left hand or elbow above your head or behind your back or head. These positions stress the tissues which are trying to heal. °STRENGTH - Scapular Depression and Adduction °· With good posture, sit on a firm chair. Supported your arms in front of you with pillows, arm rests or a table top. Have your elbows in line with the sides of your body. °· Gently draw your shoulder blades down and toward your mid-back spine. Gradually increase the tension without tensing the muscles along the top of your shoulders and the back of your neck. °· Hold for __________ seconds. Slowly release the tension and relax your muscles completely before completing the next repetition. °·   After you have practiced this exercise, remove the arm support and complete it in standing as well as sitting. °Repeat __________ times. Complete this exercise __________ times per day.  °STRENGTH - External Rotators °· Secure a rubber exercise band/tubing to a fixed object so that it is at the same height as your right / left elbow when you are standing or sitting on a firm surface. °· Stand or sit so that the secured exercise band/tubing is at your side that is not injured. °· Bend your elbow 90 degrees. Place a folded towel or small pillow under your right / left arm so that your elbow is a few inches away from your side. °· Keeping the tension on the exercise band/tubing, pull it away from your body, as if pivoting on your elbow. Be sure to keep your body steady so that the movement is only coming from your shoulder rotating. °· Hold __________ seconds. Release the tension in a controlled  manner as you return to the starting position. °Repeat __________ times. Complete this exercise __________ times per day.  °STRENGTH - Supraspinatus °· Stand or sit with good posture. Grasp a __________ weight or an exercise band/tubing so that your hand is "thumbs-up," like when you shake hands. °· Slowly lift your right / left hand from your thigh into the air, traveling about 30 degrees from straight out at your side. Lift your hand to shoulder height or as far as you can without increasing any shoulder pain. Initially, many people do not lift their hands above shoulder height. °· Avoid shrugging your right / left shoulder as your arm rises by keeping your shoulder blade tucked down and toward your mid-back spine. °· Hold for __________ seconds. Control the descent of your hand as you slowly return to your starting position. °Repeat __________ times. Complete this exercise __________ times per day.  °STRENGTH - Shoulder Extensors °· Secure a rubber exercise band/tubing so that it is at the height of your shoulders when you are either standing or sitting on a firm arm-less chair. °· With a thumbs-up grip, grasp an end of the band/tubing in each hand. Straighten your elbows and lift your hands straight in front of you at shoulder height. Step back away from the secured end of band/tubing until it becomes tense. °· Squeezing your shoulder blades together, pull your hands down to the sides of your thighs. Do not allow your hands to go behind you. °· Hold for __________ seconds. Slowly ease the tension on the band/tubing as you reverse the directions and return to the starting position. °Repeat __________ times. Complete this exercise __________ times per day.  °STRENGTH - Scapular Retractors °· Secure a rubber exercise band/tubing so that it is at the height of your shoulders when you are either standing or sitting on a firm arm-less chair. °· With a palm-down grip, grasp an end of the band/tubing in each hand.  Straighten your elbows and lift your hands straight in front of you at shoulder height. Step back away from the secured end of band/tubing until it becomes tense. °· Squeezing your shoulder blades together, draw your elbows back as you bend them. Keep your upper arm lifted away from your body throughout the exercise. °· Hold __________ seconds. Slowly ease the tension on the band/tubing as you reverse the directions and return to the starting position. °Repeat __________ times. Complete this exercise __________ times per day. °STRENGTH - Scapular Depressors °· Find a sturdy chair without wheels, such as a   from a dining room table. °· Keeping your feet on the floor, lift your bottom from the seat and lock your elbows. °· Keeping your elbows straight, allow gravity to pull your body weight down. Your shoulders will rise toward your ears. °· Raise your body against gravity by drawing your shoulder blades down your back, shortening the distance between your shoulders and ears. Although your feet should always maintain contact with the floor, your feet should progressively support less body weight as you get stronger. °· Hold __________ seconds. In a controlled and slow manner, lower your body weight to begin the next repetition. °Repeat __________ times. Complete this exercise __________ times per day.  °Document Released: 11/06/2004 Document Revised: 03/17/2011 Document Reviewed: 04/06/2008 °[employer]® Patient Information ©2015 [employer]. This information is not intended to replace advice given to you by your health care provider. Make sure you discuss any questions you have with your health care provider. ° °

## 2013-12-03 ENCOUNTER — Emergency Department (HOSPITAL_COMMUNITY): Payer: Medicaid Other

## 2013-12-03 ENCOUNTER — Emergency Department (HOSPITAL_COMMUNITY)
Admission: EM | Admit: 2013-12-03 | Discharge: 2013-12-03 | Disposition: A | Discharge status: Home / Self Care | Payer: Medicaid Other | Attending: Emergency Medicine | Admitting: Emergency Medicine

## 2013-12-03 ENCOUNTER — Encounter (HOSPITAL_COMMUNITY): Payer: Self-pay | Admitting: *Deleted

## 2013-12-03 DIAGNOSIS — I2510 Atherosclerotic heart disease of native coronary artery without angina pectoris: Secondary | ICD-10-CM | POA: Insufficient documentation

## 2013-12-03 DIAGNOSIS — S61212A Laceration without foreign body of right middle finger without damage to nail, initial encounter: Secondary | ICD-10-CM | POA: Diagnosis not present

## 2013-12-03 DIAGNOSIS — W270XXA Contact with workbench tool, initial encounter: Secondary | ICD-10-CM | POA: Diagnosis not present

## 2013-12-03 DIAGNOSIS — I10 Essential (primary) hypertension: Secondary | ICD-10-CM | POA: Diagnosis not present

## 2013-12-03 DIAGNOSIS — Z23 Encounter for immunization: Secondary | ICD-10-CM | POA: Insufficient documentation

## 2013-12-03 DIAGNOSIS — Z720 Tobacco use: Secondary | ICD-10-CM | POA: Insufficient documentation

## 2013-12-03 DIAGNOSIS — Z8719 Personal history of other diseases of the digestive system: Secondary | ICD-10-CM | POA: Insufficient documentation

## 2013-12-03 DIAGNOSIS — Z79899 Other long term (current) drug therapy: Secondary | ICD-10-CM | POA: Insufficient documentation

## 2013-12-03 DIAGNOSIS — J45909 Unspecified asthma, uncomplicated: Secondary | ICD-10-CM | POA: Insufficient documentation

## 2013-12-03 DIAGNOSIS — Y9389 Activity, other specified: Secondary | ICD-10-CM | POA: Insufficient documentation

## 2013-12-03 DIAGNOSIS — Z7982 Long term (current) use of aspirin: Secondary | ICD-10-CM | POA: Insufficient documentation

## 2013-12-03 DIAGNOSIS — S61219A Laceration without foreign body of unspecified finger without damage to nail, initial encounter: Secondary | ICD-10-CM

## 2013-12-03 DIAGNOSIS — Y998 Other external cause status: Secondary | ICD-10-CM | POA: Diagnosis not present

## 2013-12-03 DIAGNOSIS — Z791 Long term (current) use of non-steroidal anti-inflammatories (NSAID): Secondary | ICD-10-CM | POA: Diagnosis not present

## 2013-12-03 DIAGNOSIS — Y9289 Other specified places as the place of occurrence of the external cause: Secondary | ICD-10-CM | POA: Diagnosis not present

## 2013-12-03 DIAGNOSIS — Z8669 Personal history of other diseases of the nervous system and sense organs: Secondary | ICD-10-CM | POA: Insufficient documentation

## 2013-12-03 DIAGNOSIS — M1990 Unspecified osteoarthritis, unspecified site: Secondary | ICD-10-CM | POA: Diagnosis not present

## 2013-12-03 DIAGNOSIS — Z7952 Long term (current) use of systemic steroids: Secondary | ICD-10-CM | POA: Insufficient documentation

## 2013-12-03 DIAGNOSIS — Z7902 Long term (current) use of antithrombotics/antiplatelets: Secondary | ICD-10-CM | POA: Diagnosis not present

## 2013-12-03 DIAGNOSIS — IMO0002 Reserved for concepts with insufficient information to code with codable children: Secondary | ICD-10-CM

## 2013-12-03 MED ORDER — TETANUS-DIPHTH-ACELL PERTUSSIS 5-2.5-18.5 LF-MCG/0.5 IM SUSP
0.5000 mL | Freq: Once | INTRAMUSCULAR | Status: AC
Start: 2013-12-03 — End: 2013-12-03
  Administered 2013-12-03: 0.5 mL via INTRAMUSCULAR

## 2013-12-03 MED ORDER — TETANUS-DIPHTH-ACELL PERTUSSIS 5-2.5-18.5 LF-MCG/0.5 IM SUSP
0.5000 mL | Freq: Once | INTRAMUSCULAR | Status: AC
  Filled 2013-12-03: qty 0.5

## 2013-12-03 MED ORDER — HYDROCODONE-ACETAMINOPHEN 5-325 MG PO TABS
1.0000 | ORAL_TABLET | Freq: Once | ORAL | Status: AC
  Administered 2013-12-03: 1 via ORAL
  Filled 2013-12-03: qty 1

## 2013-12-03 MED ORDER — CEPHALEXIN 500 MG PO CAPS: 500.0000 mg | ORAL_CAPSULE | Freq: Four times a day (QID) | ORAL | Status: DC

## 2013-12-03 MED ORDER — HYDROCODONE-ACETAMINOPHEN 5-325 MG PO TABS: ORAL_TABLET | ORAL | Status: DC

## 2013-12-03 MED ORDER — CEPHALEXIN 500 MG PO CAPS
500.0000 mg | ORAL_CAPSULE | Freq: Once | ORAL | Status: AC
  Administered 2013-12-03: 500 mg via ORAL
  Filled 2013-12-03: qty 1

## 2013-12-03 MED ORDER — ONDANSETRON 4 MG PO TBDP: 4.0000 mg | ORAL_TABLET | Freq: Once | ORAL | Status: DC

## 2013-12-03 MED ORDER — LIDOCAINE HCL 2 % IJ SOLN
20.0000 mL | Freq: Once | INTRAMUSCULAR | Status: DC
  Filled 2013-12-03: qty 20

## 2013-12-03 MED ORDER — LIDOCAINE HCL 2 % IJ SOLN
20.0000 mL | Freq: Once | INTRAMUSCULAR | Status: AC
  Administered 2013-12-03: 400 mg via INTRADERMAL

## 2013-12-03 NOTE — ED Notes (Signed)
Patient was working with a saw today and cut the index and middle finger on the left hand. Patient is on plavix and was concerned about the bleeding.

## 2013-12-03 NOTE — ED Provider Notes (Signed)
CSN: [id number]     Arrival date & time 12/03/13  1309 History  This chart was scribed for non-physician practitioner, Wynetta EmeryNicole Kaan Tosh, PA-C, working with Lyanne CoKevin M Campos, MD, by Bronson CurbJacqueline Melvin, ED Scribe. This patient was seen in room WTR8/WTR8 and the patient's care was started at 1:26 PM.   Chief Complaint  Patient presents with  . Extremity Laceration   The history is provided by the patient. No language interpreter was used.     HPI Comments: Danny Conner is a 58 y.o. male who presents to the Emergency Department complaining of laceration to this right index finger and middle finger that occurred 30-45 minutes ago. Patient reports he accidentally cut his index and middle finger with a saw. There is associated throbbing pain at the site and controlled bleeding. Patient is currently on Plavix, and is not UTD on tetanus. He has history of HTN, CAD, and asthma. Patient is currently unemployed and is left hand dominant   Past Medical History  Diagnosis Date  . Hypertension   . Asthma   . Coronary artery disease   . Irregular heart beat   . Stomach ulcer   . Retinal detachment     right eye July 2014  . Arthritis   . Bursitis   . Gout    Past Surgical History  Procedure Laterality Date  . Cardiovascular stress test    . Appendectomy     History reviewed. No pertinent family history. History  Substance Use Topics  . Smoking status: Current Every Day Smoker -- 0.50 packs/day    Types: Cigarettes  . Smokeless tobacco: Not on file  . Alcohol Use: 1.2 oz/week    1 Cans of beer, 1 Shots of liquor per week     Comment: daily     Review of Systems  A complete 10 system review of systems was obtained and all systems are negative except as noted in the HPI and PMH.    Allergies  Codeine  Home Medications   Prior to Admission medications   Medication Sig Start Date End Date Taking? Authorizing Provider  ARTIFICIAL TEAR OINTMENT OP Apply 1 strip to eye daily as needed (for  dryness).    Historical Provider, MD  aspirin 81 MG tablet Take 81 mg by mouth daily.    Historical Provider, MD  clopidogrel (PLAVIX) 75 MG tablet Take 75 mg by mouth daily.    Historical Provider, MD  diazepam (VALIUM) 5 MG tablet Take 1 tablet (5 mg total) by mouth 2 (two) times daily. 11/28/13   Antony MaduraKelly Humes, PA-C  diclofenac sodium (VOLTAREN) 1 % GEL Apply 2 g topically 4 (four) times daily. Apply to shoulder as directed 11/28/13   Antony MaduraKelly Humes, PA-C  lisinopril (PRINIVIL,ZESTRIL) 10 MG tablet Take 10 mg by mouth daily.    Historical Provider, MD  meclizine (ANTIVERT) 25 MG tablet Take 1 tablet (25 mg total) by mouth 4 (four) times daily. 07/29/13   Hilario Quarryanielle S Ray, MD  Multiple Vitamin (MULTIVITAMIN WITH MINERALS) TABS tablet Take 1 tablet by mouth daily.    Historical Provider, MD  ondansetron (ZOFRAN ODT) 8 MG disintegrating tablet Take 1 tablet (8 mg total) by mouth every 8 (eight) hours as needed for nausea or vomiting. 07/29/13   Hilario Quarryanielle S Ray, MD  oxyCODONE-acetaminophen (PERCOCET/ROXICET) 5-325 MG per tablet Take 1 tablet by mouth every 4 (four) hours as needed for moderate pain or severe pain. 11/28/13   Antony MaduraKelly Humes, PA-C  pantoprazole (PROTONIX) 40 MG tablet Take  40 mg by mouth daily.    Historical Provider, MD  prednisoLONE acetate (PRED FORTE) 1 % ophthalmic suspension Place 1 drop into the right eye 3 (three) times daily.    Historical Provider, MD  predniSONE (DELTASONE) 20 MG tablet Take 2 tablets (40 mg total) by mouth daily. 11/28/13   Antony Madura, PA-C   Triage Vitals: BP 134/92 mmHg  Pulse 86  Temp(Src) 98.4 F (36.9 C) (Oral)  Resp 18  SpO2 98%  Physical Exam  Constitutional: He is oriented to person, place, and time. He appears well-developed and well-nourished. No distress.  HENT:  Head: Normocephalic.  Eyes: Conjunctivae and EOM are normal.  Cardiovascular: Normal rate.   Pulmonary/Chest: Effort normal. No stridor.  Musculoskeletal: Normal range of motion.   Neurological: He is alert and oriented to person, place, and time.  Skin:  Irregular stellate laceration to distal right third finger 1.25 CM, no foreign bodies, no bone exposed.  Patient also has half centimeter partial-thickness laceration to the right second digit.  Bleeding is controlled, full range of motion in flexion and extension.  Psychiatric: He has a normal mood and affect.  Nursing note and vitals reviewed.     ED Course  LACERATION REPAIR Date/Time: 12/03/2013 6:19 PM Performed by: Wynetta Emery Authorized by: Wynetta Emery Consent: Verbal consent obtained. Consent given by: patient Patient identity confirmed: verbally with patient Body area: upper extremity Location details: right long finger Laceration length: 1.3 cm Foreign bodies: no foreign bodies Tendon involvement: none Nerve involvement: none Anesthesia: digital block Local anesthetic: lidocaine 2% without epinephrine Anesthetic total: 5 ml Preparation: Patient was prepped and draped in the usual sterile fashion. Irrigation solution: saline Irrigation method: syringe Amount of cleaning: extensive Debridement: minimal Dressing: gauze packing, non-adhesive packing strip, antibiotic ointment, 4x4 sterile gauze and pressure dressing   (including critical care time)  DIAGNOSTIC STUDIES: Oxygen Saturation is 98% on room air, normal by my interpretation.    COORDINATION OF CARE: At 1329 Discussed treatment plan with patient which includes wound cleansing, imaging, and pain medication. Patient agrees.   Labs Review Labs Reviewed - No data to display  Imaging Review Dg Finger Middle Right  12/03/2013   CLINICAL DATA:  Laceration to distal finger tip  EXAM: RIGHT MIDDLE FINGER 2+V  COMPARISON:  None.  FINDINGS: Suspected distal tuft fracture.  Overlying soft tissue laceration.  No radiopaque foreign body is seen.  IMPRESSION: Suspected distal tuft fracture.  No radiopaque foreign body is seen.    Electronically Signed   By: Charline Bills M.D.   On: 12/03/2013 14:19     EKG Interpretation None      MDM   Final diagnoses:  Laceration    Filed Vitals:   12/03/13 1319  BP: 134/92  Pulse: 86  Temp: 98.4 F (36.9 C)  TempSrc: Oral  Resp: 18  SpO2: 98%    Medications  ondansetron (ZOFRAN-ODT) disintegrating tablet 4 mg (4 mg Oral Not Given 12/03/13 1359)  Tdap (BOOSTRIX) injection 0.5 mL (0 mLs Intramuscular Duplicate 12/03/13 1334)  Tdap (BOOSTRIX) injection 0.5 mL (0.5 mLs Intramuscular Given 12/03/13 1334)  HYDROcodone-acetaminophen (NORCO/VICODIN) 5-325 MG per tablet 1 tablet (1 tablet Oral Given 12/03/13 1358)  lidocaine (XYLOCAINE) 2 % (with pres) injection 400 mg (400 mg Intradermal Given 12/03/13 1358)  cephALEXin (KEFLEX) capsule 500 mg (500 mg Oral Given 12/03/13 1517)    Danny Conner is a 58 y.o. male presenting with lacerations to right third and second digit. Possible distal tuft fracture on  the right. There is no bone exposure. Patient will be started on Keflex. Wound is irrigated extensively and dressed in Xeroform and pressure dressing  Evaluation does not show pathology that would require ongoing emergent intervention or inpatient treatment. Pt is hemodynamically stable and mentating appropriately. Discussed findings and plan with patient/guardian, who agrees with care plan. All questions answered. Return precautions discussed and outpatient follow up given.   Discharge Medication List as of 12/03/2013  3:13 PM    START taking these medications   Details  cephALEXin (KEFLEX) 500 MG capsule Take 1 capsule (500 mg total) by mouth 4 (four) times daily., Starting 12/03/2013, Until Discontinued, Print    HYDROcodone-acetaminophen (NORCO/VICODIN) 5-325 MG per tablet Take 1-2 tablets by mouth every 6 hours as needed for pain., Print         I personally performed the services described in this documentation, which was scribed in my presence. The recorded  information has been reviewed and is accurate.    Wynetta Emeryicole Mishael Haran, PA-C 12/03/13 1821  Lyanne CoKevin M Campos, MD 12/04/13 [phone]

## 2013-12-03 NOTE — Discharge Instructions (Signed)
If you see signs of infection (warmth, redness, tenderness, pus, sharp increase in pain, fever) immediately return to the emergency department.  Take your antibiotics as directed   1) Wash gently morning and night with soap and water. 2) You may use a topical antibiotic ointment (bacitracin triple antibiotic ointment or neosporin, for example) 3) Cover all exposed tissue with the Xeroform petroleum dressing you were given today 4) Apply gauze and tape   Do NOT use rubbing alcohol or hydrogen peroxide   If you see signs of infection (warmth, redness, tenderness, pus, sharp increase in pain, fever, red streaking) immediately return to the emergency department.  Take vicodin for breakthrough pain, do not drink alcohol, drive, care for children or do other critical tasks while taking vicodin.     Please follow with your primary care doctor in the next 2 days for a check-up. They must obtain records for further management.   Do not hesitate to return to the Emergency Department for any new, worsening or concerning symptoms.

## 2013-12-03 NOTE — ED Notes (Signed)
Patient transported to X-ray 

## 2013-12-24 ENCOUNTER — Encounter (HOSPITAL_COMMUNITY): Payer: Self-pay | Admitting: *Deleted

## 2013-12-24 ENCOUNTER — Inpatient Hospital Stay (HOSPITAL_COMMUNITY)
Admission: EM | Admit: 2013-12-24 | Discharge: 2013-12-25 | DRG: 313 | Disposition: A | Discharge status: Home / Self Care | Payer: Medicaid Other | Attending: Cardiovascular Disease | Admitting: Cardiovascular Disease

## 2013-12-24 ENCOUNTER — Emergency Department (HOSPITAL_COMMUNITY): Payer: Medicaid Other

## 2013-12-24 DIAGNOSIS — J45909 Unspecified asthma, uncomplicated: Secondary | ICD-10-CM | POA: Diagnosis present

## 2013-12-24 DIAGNOSIS — Z720 Tobacco use: Secondary | ICD-10-CM | POA: Diagnosis not present

## 2013-12-24 DIAGNOSIS — I2510 Atherosclerotic heart disease of native coronary artery without angina pectoris: Secondary | ICD-10-CM | POA: Diagnosis present

## 2013-12-24 DIAGNOSIS — I10 Essential (primary) hypertension: Secondary | ICD-10-CM | POA: Diagnosis present

## 2013-12-24 DIAGNOSIS — R079 Chest pain, unspecified: Secondary | ICD-10-CM | POA: Diagnosis present

## 2013-12-24 DIAGNOSIS — E780 Pure hypercholesterolemia: Secondary | ICD-10-CM | POA: Diagnosis present

## 2013-12-24 DIAGNOSIS — M1000 Idiopathic gout, unspecified site: Secondary | ICD-10-CM | POA: Diagnosis present

## 2013-12-24 DIAGNOSIS — Z888 Allergy status to other drugs, medicaments and biological substances status: Secondary | ICD-10-CM | POA: Diagnosis not present

## 2013-12-24 DIAGNOSIS — Z7982 Long term (current) use of aspirin: Secondary | ICD-10-CM

## 2013-12-24 DIAGNOSIS — Z7902 Long term (current) use of antithrombotics/antiplatelets: Secondary | ICD-10-CM

## 2013-12-24 DIAGNOSIS — F419 Anxiety disorder, unspecified: Secondary | ICD-10-CM | POA: Diagnosis present

## 2013-12-24 HISTORY — DX: Cardiac arrhythmia, unspecified: I49.9

## 2013-12-24 LAB — CBC
HCT: 43.3 % (ref 39.0–52.0)
Hemoglobin: 14.1 g/dL (ref 13.0–17.0)
MCH: 30.3 pg (ref 26.0–34.0)
MCHC: 32.6 g/dL (ref 30.0–36.0)
MCV: 92.9 fL (ref 78.0–100.0)
Platelets: 264 10*3/uL (ref 150–400)
RBC: 4.66 MIL/uL (ref 4.22–5.81)
RDW: 13.3 % (ref 11.5–15.5)
WBC: 9.2 10*3/uL (ref 4.0–10.5)

## 2013-12-24 LAB — BASIC METABOLIC PANEL
Anion gap: 17 — ABNORMAL HIGH (ref 5–15)
BUN: 12 mg/dL (ref 6–23)
CO2: 20 mEq/L (ref 19–32)
Calcium: 9.5 mg/dL (ref 8.4–10.5)
Chloride: 96 mEq/L (ref 96–112)
Creatinine, Ser: 1.14 mg/dL (ref 0.50–1.35)
GFR calc Af Amer: 80 mL/min — ABNORMAL LOW (ref >90)
GFR calc non Af Amer: 69 mL/min — ABNORMAL LOW (ref >90)
Glucose, Bld: 87 mg/dL (ref 70–99)
Potassium: 4.1 mEq/L (ref 3.7–5.3)
Sodium: 133 mEq/L — ABNORMAL LOW (ref 137–147)

## 2013-12-24 LAB — I-STAT TROPONIN, ED: Troponin i, poc: 0 ng/mL (ref 0.00–0.08)

## 2013-12-24 MED ORDER — ATORVASTATIN CALCIUM 40 MG PO TABS
40.0000 mg | ORAL_TABLET | Freq: Every day | ORAL | Status: DC
  Administered 2013-12-25: 40 mg via ORAL
  Filled 2013-12-24: qty 1

## 2013-12-24 MED ORDER — ONDANSETRON HCL 4 MG/2ML IJ SOLN: 4.0000 mg | Freq: Four times a day (QID) | INTRAMUSCULAR | Status: DC | PRN

## 2013-12-24 MED ORDER — ASPIRIN 81 MG PO CHEW
324.0000 mg | CHEWABLE_TABLET | Freq: Once | ORAL | Status: AC
  Administered 2013-12-24: 324 mg via ORAL
  Filled 2013-12-24: qty 4

## 2013-12-24 MED ORDER — SODIUM CHLORIDE 0.9 % IJ SOLN
3.0000 mL | Freq: Two times a day (BID) | INTRAMUSCULAR | Status: DC
  Administered 2013-12-24: 3 mL via INTRAVENOUS

## 2013-12-24 MED ORDER — ASPIRIN EC 81 MG PO TBEC
81.0000 mg | DELAYED_RELEASE_TABLET | Freq: Every day | ORAL | Status: DC
  Administered 2013-12-25: 81 mg via ORAL
  Filled 2013-12-24: qty 1

## 2013-12-24 MED ORDER — METOPROLOL TARTRATE 12.5 MG HALF TABLET
12.5000 mg | ORAL_TABLET | Freq: Two times a day (BID) | ORAL | Status: DC
  Administered 2013-12-24 – 2013-12-25 (×2): 12.5 mg via ORAL
  Filled 2013-12-24 (×3): qty 1

## 2013-12-24 MED ORDER — MECLIZINE HCL 25 MG PO TABS
25.0000 mg | ORAL_TABLET | Freq: Four times a day (QID) | ORAL | Status: DC
  Administered 2013-12-24 – 2013-12-25 (×3): 25 mg via ORAL
  Filled 2013-12-24 (×5): qty 1

## 2013-12-24 MED ORDER — SODIUM CHLORIDE 0.9 % IJ SOLN: 3.0000 mL | INTRAMUSCULAR | Status: DC | PRN

## 2013-12-24 MED ORDER — PNEUMOCOCCAL VAC POLYVALENT 25 MCG/0.5ML IJ INJ
0.5000 mL | INJECTION | INTRAMUSCULAR | Status: AC
  Administered 2013-12-25: 0.5 mL via INTRAMUSCULAR
  Filled 2013-12-24: qty 0.5

## 2013-12-24 MED ORDER — ADULT MULTIVITAMIN W/MINERALS CH
1.0000 | ORAL_TABLET | Freq: Every day | ORAL | Status: DC
  Administered 2013-12-24 – 2013-12-25 (×2): 1 via ORAL
  Filled 2013-12-24 (×2): qty 1

## 2013-12-24 MED ORDER — SODIUM CHLORIDE 0.9 % IV SOLN: 250.0000 mL | INTRAVENOUS | Status: DC | PRN

## 2013-12-24 MED ORDER — ASPIRIN 300 MG RE SUPP
300.0000 mg | RECTAL | Status: DC
  Filled 2013-12-24: qty 1

## 2013-12-24 MED ORDER — NITROGLYCERIN 0.4 MG SL SUBL
0.4000 mg | SUBLINGUAL_TABLET | SUBLINGUAL | Status: DC | PRN
Start: 2013-12-24 — End: 2013-12-25
  Filled 2013-12-24: qty 1

## 2013-12-24 MED ORDER — NICOTINE 14 MG/24HR TD PT24
14.0000 mg | MEDICATED_PATCH | Freq: Every day | TRANSDERMAL | Status: DC
  Administered 2013-12-25 (×2): 14 mg via TRANSDERMAL
  Filled 2013-12-24 (×2): qty 1

## 2013-12-24 MED ORDER — ONDANSETRON 8 MG PO TBDP
8.0000 mg | ORAL_TABLET | Freq: Three times a day (TID) | ORAL | Status: DC | PRN
  Filled 2013-12-24: qty 1

## 2013-12-24 MED ORDER — OXYCODONE-ACETAMINOPHEN 5-325 MG PO TABS
1.0000 | ORAL_TABLET | Freq: Four times a day (QID) | ORAL | Status: DC | PRN
Start: 2013-12-24 — End: 2013-12-25

## 2013-12-24 MED ORDER — HEPARIN BOLUS VIA INFUSION
4000.0000 [IU] | Freq: Once | INTRAVENOUS | Status: AC
  Administered 2013-12-24: 4000 [IU] via INTRAVENOUS
  Filled 2013-12-24: qty 4000

## 2013-12-24 MED ORDER — SODIUM CHLORIDE 0.9 % IV SOLN
INTRAVENOUS | Status: DC
  Administered 2013-12-24: 10 mL via INTRAVENOUS

## 2013-12-24 MED ORDER — PANTOPRAZOLE SODIUM 40 MG PO TBEC
40.0000 mg | DELAYED_RELEASE_TABLET | Freq: Every day | ORAL | Status: DC
  Administered 2013-12-24 – 2013-12-25 (×2): 40 mg via ORAL
  Filled 2013-12-24 (×2): qty 1

## 2013-12-24 MED ORDER — MORPHINE SULFATE 4 MG/ML IJ SOLN
4.0000 mg | Freq: Once | INTRAMUSCULAR | Status: AC
  Administered 2013-12-24: 4 mg via INTRAVENOUS
  Filled 2013-12-24: qty 1

## 2013-12-24 MED ORDER — CLOPIDOGREL BISULFATE 75 MG PO TABS
75.0000 mg | ORAL_TABLET | Freq: Every day | ORAL | Status: DC
  Administered 2013-12-25: 75 mg via ORAL
  Filled 2013-12-24: qty 1

## 2013-12-24 MED ORDER — LISINOPRIL 10 MG PO TABS
10.0000 mg | ORAL_TABLET | Freq: Every day | ORAL | Status: DC
  Administered 2013-12-25: 10 mg via ORAL
  Filled 2013-12-24: qty 1

## 2013-12-24 MED ORDER — HYDROCODONE-ACETAMINOPHEN 5-325 MG PO TABS: 1.0000 | ORAL_TABLET | ORAL | Status: DC | PRN

## 2013-12-24 MED ORDER — HEPARIN (PORCINE) IN NACL 100-0.45 UNIT/ML-% IJ SOLN
900.0000 [IU]/h | INTRAMUSCULAR | Status: DC
  Administered 2013-12-24: 900 [IU]/h via INTRAVENOUS
  Filled 2013-12-24 (×2): qty 250

## 2013-12-24 MED ORDER — DIAZEPAM 5 MG PO TABS
5.0000 mg | ORAL_TABLET | Freq: Two times a day (BID) | ORAL | Status: DC
  Administered 2013-12-24 – 2013-12-25 (×2): 5 mg via ORAL
  Filled 2013-12-24 (×2): qty 1

## 2013-12-24 MED ORDER — ASPIRIN 81 MG PO CHEW: 324.0000 mg | CHEWABLE_TABLET | ORAL | Status: DC

## 2013-12-24 MED ORDER — ACETAMINOPHEN 325 MG PO TABS: 650.0000 mg | ORAL_TABLET | ORAL | Status: DC | PRN

## 2013-12-24 NOTE — ED Notes (Signed)
Pt reports L chest pain, tightness, x3 days. Denies n/v, diaphoresis, arm pain. Major family Hx CAD, MI. Has had cardiac cath twice, sts no stents but is on Plavix.

## 2013-12-24 NOTE — ED Notes (Signed)
Patient transported to X-ray 

## 2013-12-24 NOTE — Progress Notes (Signed)
ANTICOAGULATION CONSULT NOTE - Initial Consult  Pharmacy Consult for heparin Indication: chest pain/ACS  Allergies  Allergen Reactions  . Codeine Nausea And Vomiting    Patient Measurements: Height: 5\' 8"  (172.7 cm) Weight: 163 lb 2.3 oz (74 kg) IBW/kg (Calculated) : 68.4  Vital Signs: Temp: 97.8 F (36.6 C) (12/19 2310) Temp Source: Oral (12/19 2310) BP: 158/95 mmHg (12/19 2310) Pulse Rate: 64 (12/19 2310)  Labs:  Recent Labs  12/24/13 1806  HGB 14.1  HCT 43.3  PLT 264  CREATININE 1.14    Estimated Creatinine Clearance: 67.5 mL/min (by C-G formula based on Cr of 1.14).   Medical History: Past Medical History  Diagnosis Date  . Hypertension   . Asthma   . Coronary artery disease   . Irregular heart beat   . Stomach ulcer   . Retinal detachment     right eye July 2014  . Arthritis   . Bursitis   . Gout     Medications:  Prescriptions prior to admission  Medication Sig Dispense Refill Last Dose  . ARTIFICIAL TEAR OINTMENT OP Apply 1 strip to eye daily as needed (for dryness).   12/24/2013 at Unknown time  . aspirin 81 MG tablet Take 81 mg by mouth daily.   12/24/2013 at Unknown time  . clopidogrel (PLAVIX) 75 MG tablet Take 75 mg by mouth daily.   12/24/2013 at Unknown time  . diazepam (VALIUM) 5 MG tablet Take 1 tablet (5 mg total) by mouth 2 (two) times daily. 12 tablet 0 Past Week at Unknown time  . diclofenac sodium (VOLTAREN) 1 % GEL Apply 2 g topically 4 (four) times daily. Apply to shoulder as directed 100 g 0 Past Week at Unknown time  . HYDROcodone-acetaminophen (NORCO/VICODIN) 5-325 MG per tablet Take 1-2 tablets by mouth every 6 hours as needed for pain. 15 tablet 0 Past Week at Unknown time  . ibuprofen (ADVIL,MOTRIN) 200 MG tablet Take 800 mg by mouth every 6 (six) hours as needed for moderate pain (pain).   12/23/2013 at Unknown time  . lisinopril (PRINIVIL,ZESTRIL) 10 MG tablet Take 10 mg by mouth daily.   12/24/2013 at Unknown time  .  meclizine (ANTIVERT) 25 MG tablet Take 1 tablet (25 mg total) by mouth 4 (four) times daily. 28 tablet 0 Past Month at Unknown time  . Multiple Vitamin (MULTIVITAMIN WITH MINERALS) TABS tablet Take 1 tablet by mouth daily.   12/23/2013 at Unknown time  . pantoprazole (PROTONIX) 40 MG tablet Take 40 mg by mouth daily.   12/24/2013 at Unknown time  . prednisoLONE acetate (PRED FORTE) 1 % ophthalmic suspension Place 1 drop into the right eye 3 (three) times daily.   12/24/2013 at Unknown time  . cephALEXin (KEFLEX) 500 MG capsule Take 1 capsule (500 mg total) by mouth 4 (four) times daily. (Patient not taking: Reported on 12/24/2013) 20 capsule 0 Completed Course at Unknown time  . ondansetron (ZOFRAN ODT) 8 MG disintegrating tablet Take 1 tablet (8 mg total) by mouth every 8 (eight) hours as needed for nausea or vomiting. 20 tablet 0 unknown at unknown time  . oxyCODONE-acetaminophen (PERCOCET/ROXICET) 5-325 MG per tablet Take 1 tablet by mouth every 4 (four) hours as needed for moderate pain or severe pain. 15 tablet 0 unknown at unknown time  . predniSONE (DELTASONE) 20 MG tablet Take 2 tablets (40 mg total) by mouth daily. (Patient not taking: Reported on 12/24/2013) 10 tablet 0 Completed Course at Unknown time   Scheduled:  .  aspirin  324 mg Oral NOW   Or  . aspirin  300 mg Rectal NOW  . [START ON 12/25/2013] aspirin EC  81 mg Oral Daily  . [START ON 12/25/2013] atorvastatin  40 mg Oral q1800  . [START ON 12/25/2013] clopidogrel  75 mg Oral Daily  . diazepam  5 mg Oral BID  . [START ON 12/25/2013] lisinopril  10 mg Oral Daily  . meclizine  25 mg Oral QID  . metoprolol tartrate  12.5 mg Oral BID  . multivitamin with minerals  1 tablet Oral Daily  . pantoprazole  40 mg Oral Daily  . sodium chloride  3 mL Intravenous Q12H     Assessment: 58yo male c/o CP/tightness x3d, initial troponin negative, to begin heparin.  Goal of Therapy:  Heparin level 0.3-0.7 units/ml Monitor platelets by  anticoagulation protocol: Yes   Plan:  Will give heparin 4000 units IV bolus followed by gtt at 900 units/hr and monitor heparin levels and CBC.  Vernard GamblesVeronda Jacquelynne Guedes, PharmD, BCPS  12/24/2013,11:11 PM

## 2013-12-24 NOTE — ED Provider Notes (Signed)
CSN: [id number]     Arrival date & time 12/24/13  1740 History   First MD Initiated Contact with Patient 12/24/13 1817     Chief Complaint  Patient presents with  . Chest Pain     (Consider location/radiation/quality/duration/timing/severity/associated sxs/prior Treatment) HPI Comments: Patient with past medical history of CAD, hypertension, presents emergency department with chief complaint of chest pain.  Patient states that he has had left-sided chest tightness for the past 3 days. He reports some associated nausea, but denies any vomiting or diaphoresis. States that the pain radiates to the left side of his chest. Reportedly he has had 2 cardiac catheterizations. No stents, but is currently on Plavix and aspirin. Patient states the pain is moderate in severity.  The history is provided by the patient. No language interpreter was used.    Past Medical History  Diagnosis Date  . Hypertension   . Asthma   . Coronary artery disease   . Irregular heart beat   . Stomach ulcer   . Retinal detachment     right eye July 2014  . Arthritis   . Bursitis   . Gout    Past Surgical History  Procedure Laterality Date  . Cardiovascular stress test    . Appendectomy     No family history on file. History  Substance Use Topics  . Smoking status: Current Every Day Smoker -- 0.50 packs/day    Types: Cigarettes  . Smokeless tobacco: Not on file  . Alcohol Use: 1.2 oz/week    1 Cans of beer, 1 Shots of liquor per week     Comment: daily     Review of Systems  Constitutional: Negative for fever and chills.  Respiratory: Positive for shortness of breath.   Cardiovascular: Positive for chest pain.  Gastrointestinal: Negative for nausea, vomiting, diarrhea and constipation.  Genitourinary: Negative for dysuria.  All other systems reviewed and are negative.     Allergies  Codeine  Home Medications   Prior to Admission medications   Medication Sig Start Date End Date Taking?  Authorizing Provider  ARTIFICIAL TEAR OINTMENT OP Apply 1 strip to eye daily as needed (for dryness).    Historical Provider, MD  aspirin 81 MG tablet Take 81 mg by mouth daily.    Historical Provider, MD  cephALEXin (KEFLEX) 500 MG capsule Take 1 capsule (500 mg total) by mouth 4 (four) times daily. 12/03/13   Nicole Pisciotta, PA-C  clopidogrel (PLAVIX) 75 MG tablet Take 75 mg by mouth daily.    Historical Provider, MD  diazepam (VALIUM) 5 MG tablet Take 1 tablet (5 mg total) by mouth 2 (two) times daily. 11/28/13   Antony MaduraKelly Humes, PA-C  diclofenac sodium (VOLTAREN) 1 % GEL Apply 2 g topically 4 (four) times daily. Apply to shoulder as directed 11/28/13   Antony MaduraKelly Humes, PA-C  HYDROcodone-acetaminophen (NORCO/VICODIN) 5-325 MG per tablet Take 1-2 tablets by mouth every 6 hours as needed for pain. 12/03/13   Nicole Pisciotta, PA-C  lisinopril (PRINIVIL,ZESTRIL) 10 MG tablet Take 10 mg by mouth daily.    Historical Provider, MD  meclizine (ANTIVERT) 25 MG tablet Take 1 tablet (25 mg total) by mouth 4 (four) times daily. 07/29/13   Hilario Quarryanielle S Ray, MD  Multiple Vitamin (MULTIVITAMIN WITH MINERALS) TABS tablet Take 1 tablet by mouth daily.    Historical Provider, MD  ondansetron (ZOFRAN ODT) 8 MG disintegrating tablet Take 1 tablet (8 mg total) by mouth every 8 (eight) hours as needed for nausea or  vomiting. 07/29/13   Hilario Quarry, MD  oxyCODONE-acetaminophen (PERCOCET/ROXICET) 5-325 MG per tablet Take 1 tablet by mouth every 4 (four) hours as needed for moderate pain or severe pain. 11/28/13   Antony Madura, PA-C  pantoprazole (PROTONIX) 40 MG tablet Take 40 mg by mouth daily.    Historical Provider, MD  prednisoLONE acetate (PRED FORTE) 1 % ophthalmic suspension Place 1 drop into the right eye 3 (three) times daily.    Historical Provider, MD  predniSONE (DELTASONE) 20 MG tablet Take 2 tablets (40 mg total) by mouth daily. 11/28/13   Antony Madura, PA-C   There were no vitals taken for this visit. Physical  Exam  Constitutional: He is oriented to person, place, and time. He appears well-developed and well-nourished.  HENT:  Head: Normocephalic and atraumatic.  Eyes: Conjunctivae and EOM are normal. Pupils are equal, round, and reactive to light. Right eye exhibits no discharge. Left eye exhibits no discharge. No scleral icterus.  Neck: Normal range of motion. Neck supple. No JVD present.  Cardiovascular: Normal rate, regular rhythm and normal heart sounds.  Exam reveals no gallop and no friction rub.   No murmur heard. Pulmonary/Chest: Effort normal and breath sounds normal. No respiratory distress. He has no wheezes. He has no rales. He exhibits no tenderness.  Abdominal: Soft. He exhibits no distension and no mass. There is no tenderness. There is no rebound and no guarding.  Musculoskeletal: Normal range of motion. He exhibits no edema or tenderness.  Neurological: He is alert and oriented to person, place, and time.  Skin: Skin is warm and dry.  Psychiatric: He has a normal mood and affect. His behavior is normal. Judgment and thought content normal.  Nursing note and vitals reviewed.   ED Course  Procedures (including critical care time) Results for orders placed or performed during the hospital encounter of 12/24/13  CBC  Result Value Ref Range   WBC 9.2 4.0 - 10.5 K/uL   RBC 4.66 4.22 - 5.81 MIL/uL   Hemoglobin 14.1 13.0 - 17.0 g/dL   HCT 16.1 09.6 - 04.5 %   MCV 92.9 78.0 - 100.0 fL   MCH 30.3 26.0 - 34.0 pg   MCHC 32.6 30.0 - 36.0 g/dL   RDW 40.9 81.1 - 91.4 %   Platelets 264 150 - 400 K/uL  Basic metabolic panel  Result Value Ref Range   Sodium 133 (L) 137 - 147 mEq/L   Potassium 4.1 3.7 - 5.3 mEq/L   Chloride 96 96 - 112 mEq/L   CO2 20 19 - 32 mEq/L   Glucose, Bld 87 70 - 99 mg/dL   BUN 12 6 - 23 mg/dL   Creatinine, Ser 7.82 0.50 - 1.35 mg/dL   Calcium 9.5 8.4 - 95.6 mg/dL   GFR calc non Af Amer 69 (L) >90 mL/min   GFR calc Af Amer 80 (L) >90 mL/min   Anion gap 17  (H) 5 - 15  I-stat troponin, ED (not at [facility])  Result Value Ref Range   Troponin i, poc 0.00 0.00 - 0.08 ng/mL   Comment 3           Dg Chest 2 View  12/24/2013   CLINICAL DATA:  Left pectus pain, shortness of breath, dizziness.  EXAM: CHEST  2 VIEW  COMPARISON:  Chest radiograph 04/15/2012  FINDINGS: Multiple monitoring leads overlie the patient. Stable cardiac and mediastinal contours. No consolidative pulmonary opacities. No pleural effusion or pneumothorax. Mid thoracic spine degenerative changes.  Old posterior left rib fractures.  IMPRESSION: No acute cardiopulmonary process.   Electronically Signed   By: Annia Beltrew  Davis M.D.   On: 12/24/2013 19:07   Dg Shoulder Left  11/27/2013   CLINICAL DATA:  Left shoulder pain for 2 days.  EXAM: LEFT SHOULDER - 2+ VIEW  COMPARISON:  None.  FINDINGS: No acute fracture or dislocation. No erosive changes or soft tissue mineralization. No glenohumeral degenerative change.  IMPRESSION: Negative.   Electronically Signed   By: Tiburcio PeaJonathan  Watts M.D.   On: 11/27/2013 22:55   Dg Finger Middle Right  12/03/2013   CLINICAL DATA:  Laceration to distal finger tip  EXAM: RIGHT MIDDLE FINGER 2+V  COMPARISON:  None.  FINDINGS: Suspected distal tuft fracture.  Overlying soft tissue laceration.  No radiopaque foreign body is seen.  IMPRESSION: Suspected distal tuft fracture.  No radiopaque foreign body is seen.   Electronically Signed   By: Charline BillsSriyesh  Krishnan M.D.   On: 12/03/2013 14:19     Imaging Review No results found.   EKG Interpretation   Date/Time:  Saturday December 24 2013 17:47:27 EST Ventricular Rate:  78 PR Interval:  177 QRS Duration: 99 QT Interval:  363 QTC Calculation: 413 R Axis:   -3 Text Interpretation:  Sinus rhythm Borderline T wave abnormalities since  last tracing no significant change Confirmed by BELFI  MD, MELANIE ([id number])  on 12/24/2013 7:16:38 PM      MDM   Final diagnoses:  Chest pain   Patient with chest pain, will give  aspirin and nitroglycerin and morphine. Will check labs, chest x-ray, and EKG. Patient does have significant cardiac risk factors. He may benefit from cardiac rule out.  7:35 PM Patient discussed with Dr. Fredderick PhenixBelfi, who agrees with plan for admission, as patients picture now sounds to be more unstable angina. Patient discussed with Dr. Algie CofferKadakia, who will see the patient. Plan for observation admission.    Roxy Horsemanobert Lecia Esperanza, PA-C 12/24/13 1936  Rolan BuccoMelanie Belfi, MD 12/25/13 0001

## 2013-12-24 NOTE — H&P (Signed)
Referring Physician:  [name] is an 58 y.o. male.                       Chief Complaint: Chest pain  HPI: 58 year old male with past medical history of CAD, hypertension, presents emergency department with chief complaint of left sided chest pain of moderate intensity x 3 days. Positive nausea and no sweating spell. Patient has had two cardiac catheterizations. No stents were placed, but is currently on Plavix and aspirin. No fever or cough.  Past Medical History  Diagnosis Date  . Hypertension   . Asthma   . Coronary artery disease   . Irregular heart beat   . Stomach ulcer   . Retinal detachment     right eye July 2014  . Arthritis   . Bursitis   . Gout       Past Surgical History  Procedure Laterality Date  . Cardiovascular stress test    . Appendectomy      No family history on file. Social History:  reports that he has been smoking Cigarettes.  He has been smoking about 0.50 packs per day. He does not have any smokeless tobacco history on file. He reports that he drinks about 1.2 oz of alcohol per week. He reports that he does not use illicit drugs.  Allergies:  Allergies  Allergen Reactions  . Codeine Nausea And Vomiting     (Not in a hospital admission)  Results for orders placed or performed during the hospital encounter of 12/24/13 (from the past 48 hour(s))  CBC     Status: None   Collection Time: 12/24/13  6:06 PM  Result Value Ref Range   WBC 9.2 4.0 - 10.5 K/uL   RBC 4.66 4.22 - 5.81 MIL/uL   Hemoglobin 14.1 13.0 - 17.0 g/dL   HCT 43.3 39.0 - 52.0 %   MCV 92.9 78.0 - 100.0 fL   MCH 30.3 26.0 - 34.0 pg   MCHC 32.6 30.0 - 36.0 g/dL   RDW 13.3 11.5 - 15.5 %   Platelets 264 150 - 400 K/uL  Basic metabolic panel     Status: Abnormal   Collection Time: 12/24/13  6:06 PM  Result Value Ref Range   Sodium 133 (L) 137 - 147 mEq/L   Potassium 4.1 3.7 - 5.3 mEq/L   Chloride 96 96 - 112 mEq/L   CO2 20 19 - 32 mEq/L   Glucose, Bld 87 70 - 99 mg/dL   BUN  12 6 - 23 mg/dL   Creatinine, Ser 1.14 0.50 - 1.35 mg/dL   Calcium 9.5 8.4 - 10.5 mg/dL   GFR calc non Af Amer 69 (L) >90 mL/min   GFR calc Af Amer 80 (L) >90 mL/min    Comment: (NOTE) The eGFR has been calculated using the CKD EPI equation. This calculation has not been validated in all clinical situations. eGFR's persistently <90 mL/min signify possible Chronic Kidney Disease.    Anion gap 17 (H) 5 - 15  I-stat troponin, ED (not at [facility])     Status: None   Collection Time: 12/24/13  6:25 PM  Result Value Ref Range   Troponin i, poc 0.00 0.00 - 0.08 ng/mL   Comment 3            Comment: Due to the release kinetics of cTnI, a negative result within the first hours of the onset of symptoms does not rule out myocardial infarction with certainty.  If myocardial infarction is still suspected, repeat the test at appropriate intervals.    Dg Chest 2 View  12/24/2013   CLINICAL DATA:  Left pectus pain, shortness of breath, dizziness.  EXAM: CHEST  2 VIEW  COMPARISON:  Chest radiograph 04/15/2012  FINDINGS: Multiple monitoring leads overlie the patient. Stable cardiac and mediastinal contours. No consolidative pulmonary opacities. No pleural effusion or pneumothorax. Mid thoracic spine degenerative changes. Old posterior left rib fractures.  IMPRESSION: No acute cardiopulmonary process.   Electronically Signed   By: Lovey Newcomer M.D.   On: 12/24/2013 19:07    Review Of Systems Constitutional: Negative for fever and chills.  Respiratory: Positive for shortness of breath.  Cardiovascular: Positive for chest pain.  Gastrointestinal: Negative for nausea, vomiting, diarrhea and constipation.  Genitourinary: Negative for dysuria.  All other systems reviewed and are negative.  Blood pressure 144/87, pulse 75, temperature 97.9 F (36.6 C), temperature source Oral, resp. rate 16, SpO2 97 %. Physical Exam  Constitutional: He is oriented to person, place, and time. He appears well-developed and  well-nourished.  HENT: Normocephalic and atraumatic, brown eyes,conjunctivae and EOM are normal. Pupils are equal, round, and reactive to light. Right eye exhibits no discharge. Left eye exhibits no discharge. No scleral icterus.  Neck: Normal range of motion. Neck supple. No JVD present.  Cardiovascular: Normal rate, regular rhythm and normal heart sounds. Exam reveals no gallop and no friction rub.No murmur heard. Pulmonary/Chest: Effort normal and breath sounds normal. No respiratory distress. He has no wheezes. He has no rales. He exhibits no tenderness.  Abdominal: Soft. He exhibits no distension and no mass. There is no tenderness. There is no rebound and no guarding.  Musculoskeletal: Normal range of motion. He exhibits no edema or tenderness.  Neurological: He is alert and oriented to person, place, and time.  Skin: Skin is warm and dry.  Psychiatric: He has a normal mood and affect. His behavior is normal. Judgment and thought content normal.  Nursing note and vitals reviewed.  Assessment/Plan Chest pain R/O MI Hypertension Hypercholesterolemia Asthma Gouty arthritis Tobacco use disorder CAD  Admit Nuclear stress test or cardiac cath if chest pain persist or abnormal troponin-I.  Birdie Riddle, MD  12/24/2013, 8:07 PM

## 2013-12-24 NOTE — Progress Notes (Signed)
Per pt's request notified md about nicotine patch and percocet order.  Pt stated  Could not take Vicodin but could take percocet.  Vs stable will continue to monitor. Karena Addisonoro, Aadhav Uhlig T

## 2013-12-25 ENCOUNTER — Inpatient Hospital Stay (HOSPITAL_COMMUNITY): Payer: Medicaid Other

## 2013-12-25 LAB — LIPID PANEL
Cholesterol: 199 mg/dL (ref 0–200)
HDL: 45 mg/dL (ref >39)
LDL Cholesterol: 123 mg/dL — ABNORMAL HIGH (ref 0–99)
Total CHOL/HDL Ratio: 4.4 RATIO
Triglycerides: 153 mg/dL — ABNORMAL HIGH (ref <150)
VLDL: 31 mg/dL (ref 0–40)

## 2013-12-25 LAB — CBC
HCT: 42.3 % (ref 39.0–52.0)
Hemoglobin: 13.9 g/dL (ref 13.0–17.0)
MCH: 30.1 pg (ref 26.0–34.0)
MCHC: 32.9 g/dL (ref 30.0–36.0)
MCV: 91.6 fL (ref 78.0–100.0)
Platelets: 247 10*3/uL (ref 150–400)
RBC: 4.62 MIL/uL (ref 4.22–5.81)
RDW: 13.5 % (ref 11.5–15.5)
WBC: 6.6 10*3/uL (ref 4.0–10.5)

## 2013-12-25 LAB — BASIC METABOLIC PANEL
Anion gap: 16 — ABNORMAL HIGH (ref 5–15)
BUN: 16 mg/dL (ref 6–23)
CO2: 22 mEq/L (ref 19–32)
Calcium: 9.1 mg/dL (ref 8.4–10.5)
Chloride: 99 mEq/L (ref 96–112)
Creatinine, Ser: 0.99 mg/dL (ref 0.50–1.35)
GFR calc Af Amer: >90 mL/min (ref >90)
GFR calc non Af Amer: 88 mL/min — ABNORMAL LOW (ref >90)
Glucose, Bld: 109 mg/dL — ABNORMAL HIGH (ref 70–99)
Potassium: 4 mEq/L (ref 3.7–5.3)
Sodium: 137 mEq/L (ref 137–147)

## 2013-12-25 LAB — TROPONIN I
Troponin I: <0.3 ng/mL (ref <0.30)
Troponin I: <0.3 ng/mL (ref <0.30)

## 2013-12-25 LAB — HEPARIN LEVEL (UNFRACTIONATED)
Heparin Unfractionated: 0.31 IU/mL (ref 0.30–0.70)
Heparin Unfractionated: 0.18 IU/mL — ABNORMAL LOW (ref 0.30–0.70)

## 2013-12-25 LAB — MRSA PCR SCREENING: MRSA by PCR: NEGATIVE

## 2013-12-25 LAB — PROTIME-INR
INR: 1.1 (ref 0.00–1.49)
Prothrombin Time: 14.3 seconds (ref 11.6–15.2)

## 2013-12-25 MED ORDER — HEPARIN BOLUS VIA INFUSION
2000.0000 [IU] | Freq: Once | INTRAVENOUS | Status: AC
  Administered 2013-12-25: 2000 [IU] via INTRAVENOUS
  Filled 2013-12-25: qty 2000

## 2013-12-25 MED ORDER — ATORVASTATIN CALCIUM 40 MG PO TABS: 40.0000 mg | ORAL_TABLET | Freq: Every day | ORAL | Status: DC

## 2013-12-25 MED ORDER — HEPARIN (PORCINE) IN NACL 100-0.45 UNIT/ML-% IJ SOLN
1150.0000 [IU]/h | INTRAMUSCULAR | Status: DC
  Administered 2013-12-25: 1150 [IU]/h via INTRAVENOUS

## 2013-12-25 MED ORDER — TECHNETIUM TC 99M SESTAMIBI GENERIC - CARDIOLITE
30.0000 | Freq: Once | INTRAVENOUS | Status: AC | PRN
  Administered 2013-12-25: 30 via INTRAVENOUS

## 2013-12-25 MED ORDER — METOPROLOL TARTRATE 25 MG PO TABS: 25.0000 mg | ORAL_TABLET | Freq: Two times a day (BID) | ORAL | Status: DC

## 2013-12-25 MED ORDER — REGADENOSON 0.4 MG/5ML IV SOLN
0.4000 mg | Freq: Once | INTRAVENOUS | Status: AC
  Administered 2013-12-25: 0.4 mg via INTRAVENOUS

## 2013-12-25 MED ORDER — TECHNETIUM TC 99M SESTAMIBI GENERIC - CARDIOLITE
10.0000 | Freq: Once | INTRAVENOUS | Status: AC | PRN
  Administered 2013-12-25: 10 via INTRAVENOUS

## 2013-12-25 MED ORDER — NITROGLYCERIN 0.4 MG SL SUBL: 0.4000 mg | SUBLINGUAL_TABLET | SUBLINGUAL | Status: DC | PRN

## 2013-12-25 MED ORDER — NICOTINE 14 MG/24HR TD PT24: 14.0000 mg | MEDICATED_PATCH | Freq: Every day | TRANSDERMAL | Status: DC

## 2013-12-25 MED ORDER — REGADENOSON 0.4 MG/5ML IV SOLN
INTRAVENOUS | Status: AC
  Filled 2013-12-25: qty 5

## 2013-12-25 NOTE — Progress Notes (Signed)
ANTICOAGULATION CONSULT NOTE - Follow Up Consult  Pharmacy Consult for heparin Indication: chest pain/ACS  Labs:  Recent Labs  12/24/13 1806 12/25/13 0555  HGB 14.1 13.9  HCT 43.3 42.3  PLT 264 247  LABPROT  --  14.3  INR  --  1.10  HEPARINUNFRC  --  0.31  CREATININE 1.14 0.99  TROPONINI  --  <0.30     Assessment/Plan:  58yo male therapeutic on heparin with initial dosing for CP. Will continue gtt at current rate and confirm stable with additional level.   Vernard GamblesVeronda Thanh Mottern, PharmD, BCPS  12/25/2013,6:56 AM

## 2013-12-25 NOTE — Progress Notes (Signed)
Pt discharged and given AVS.  Medications reviewed and questions answered.  Significant other present during conversation. Pt ambulated off unit without difficulty.  Siobahn Worsley, [facility] CampusMelissa Hope

## 2013-12-25 NOTE — Discharge Summary (Signed)
Physician Discharge Summary  Patient ID: Danny SalvageKent Albo MRN: [id number] DOB/AGE: 59/07/1955 59 y.o.  Admit date: 12/24/2013 Discharge date: 12/25/2013  Admission Diagnoses: Chest pain R/O MI Hypertension Hypercholesterolemia Asthma Gouty arthritis Tobacco use disorder CAD  Discharge Diagnoses:  Principle Problems: * Chest pain at rest* Hypertension Hypercholesterolemia Asthma Gouty arthritis Tobacco use disorder CAD Anxiety  Discharged Condition: fair  Hospital Course: 58 year old male with past medical history of CAD, hypertension, presented to emergency department with chief complaint of left sided chest pain of moderate intensity x 3 days. He had nausea and no sweating spell. Patient has had two cardiac catheterizations with mild to moderate multivessel native vessel CAD. No stents were placed, but is currently on Plavix and aspirin. No fever or cough.  Consults: cardiology  Significant Diagnostic Studies: labs: Normal CBC, BMET and Troponin-I.  EKG- Normal sinus rhythm with non-specific ST-T changes.  Nuclear stress test without reversible ischemia and EF of 47 %.  Treatments: cardiac meds: lisinopril (Prinivil), Metoprolol, Aspirin, Plavix and Atorvastatin  Discharge Exam: Blood pressure 119/75, pulse 64, temperature 98.4 F (36.9 C), temperature source Oral, resp. rate 20, height 5\' 8"  (1.727 m), weight 74.5 kg (164 lb 3.9 oz), SpO2 97 %. Physical Exam  Constitutional: He appears well-developed and well-nourished.  HENT: Normocephalic and atraumatic, brown eyes,conjunctivae and EOM are normal. Pupils are equal, round, and reactive to light. No scleral icterus.  Neck: Normal range of motion. Neck supple. No JVD present.  Cardiovascular: Normal rate, regular rhythm and normal heart sounds. Exam reveals no gallop and no friction rub.No murmur heard. Pulmonary/Chest: Effort normal and breath sounds normal. No respiratory distress. He has no wheezes. He has no rales.  He exhibits no tenderness.  Abdominal: Soft. He exhibits no distension and no mass. There is no tenderness. There is no rebound and no guarding.  Musculoskeletal: Normal range of motion. He exhibits no edema or tenderness.  Neurological: He is alert and oriented to person, place, and time.  Skin: Skin is warm and dry.  Psychiatric: He has a normal mood and affect. His behavior is normal. Judgment and thought content normal.  Nursing note and vitals reviewed.  Disposition: 01-Home or Self Care     Medication List    STOP taking these medications        cephALEXin 500 MG capsule  Commonly known as:  KEFLEX     HYDROcodone-acetaminophen 5-325 MG per tablet  Commonly known as:  NORCO/VICODIN     ibuprofen 200 MG tablet  Commonly known as:  ADVIL,MOTRIN     ondansetron 8 MG disintegrating tablet  Commonly known as:  ZOFRAN ODT     predniSONE 20 MG tablet  Commonly known as:  DELTASONE      TAKE these medications        ARTIFICIAL TEAR OINTMENT OP  Apply 1 strip to eye daily as needed (for dryness).     aspirin 81 MG tablet  Take 81 mg by mouth daily.     atorvastatin 40 MG tablet  Commonly known as:  LIPITOR  Take 1 tablet (40 mg total) by mouth daily at 6 PM.     clopidogrel 75 MG tablet  Commonly known as:  PLAVIX  Take 75 mg by mouth daily.     diazepam 5 MG tablet  Commonly known as:  VALIUM  Take 1 tablet (5 mg total) by mouth 2 (two) times daily.     diclofenac sodium 1 % Gel  Commonly known as:  VOLTAREN  Apply 2 g topically 4 (four) times daily. Apply to shoulder as directed     lisinopril 10 MG tablet  Commonly known as:  PRINIVIL,ZESTRIL  Take 10 mg by mouth daily.     meclizine 25 MG tablet  Commonly known as:  ANTIVERT  Take 1 tablet (25 mg total) by mouth 4 (four) times daily.     metoprolol tartrate 25 MG tablet  Commonly known as:  LOPRESSOR  Take 1 tablet (25 mg total) by mouth 2 (two) times daily.     multivitamin with minerals Tabs  tablet  Take 1 tablet by mouth daily.     nicotine 14 mg/24hr patch  Commonly known as:  NICODERM CQ - dosed in mg/24 hours  Place 1 patch (14 mg total) onto the skin daily.     nitroGLYCERIN 0.4 MG SL tablet  Commonly known as:  NITROSTAT  Place 1 tablet (0.4 mg total) under the tongue every 5 (five) minutes x 3 doses as needed for chest pain.     oxyCODONE-acetaminophen 5-325 MG per tablet  Commonly known as:  PERCOCET/ROXICET  Take 1 tablet by mouth every 4 (four) hours as needed for moderate pain or severe pain.     pantoprazole 40 MG tablet  Commonly known as:  PROTONIX  Take 40 mg by mouth daily.     prednisoLONE acetate 1 % ophthalmic suspension  Commonly known as:  PRED FORTE  Place 1 drop into the right eye 3 (three) times daily.           Follow-up Information    Follow up with Pola CornSPRUILL,JEROME O, MD. Schedule an appointment as soon as possible for a visit in 1 month.   Specialty:  Cardiology   Contact information:   [address] [place] [address] [phone]       Signed: Ricki RodriguezKADAKIA,Autie Vasudevan S 12/25/2013, 5:59 PM

## 2013-12-25 NOTE — Progress Notes (Signed)
ANTICOAGULATION CONSULT NOTE - Initial Consult  Pharmacy Consult for heparin Indication: chest pain/ACS  Allergies  Allergen Reactions  . Codeine Nausea And Vomiting    Patient Measurements: Height: 5\' 8"  (172.7 cm) Weight: 164 lb 3.9 oz (74.5 kg) IBW/kg (Calculated) : 68.4  Vital Signs: Temp: 98 F (36.7 C) (12/20 1139) Temp Source: Oral (12/20 1139) BP: 137/58 mmHg (12/20 1139)  Labs:  Recent Labs  12/24/13 1806 12/25/13 0555 12/25/13 1350  HGB 14.1 13.9  --   HCT 43.3 42.3  --   PLT 264 247  --   LABPROT  --  14.3  --   INR  --  1.10  --   HEPARINUNFRC  --  0.31 0.18*  CREATININE 1.14 0.99  --   TROPONINI  --  <0.30 <0.30    Estimated Creatinine Clearance: 77.7 mL/min (by C-G formula based on Cr of 0.99).   Medical History: Past Medical History  Diagnosis Date  . Hypertension   . Asthma   . Coronary artery disease   . Irregular heart beat   . Stomach ulcer   . Retinal detachment     right eye July 2014  . Arthritis   . Bursitis   . Gout   . Dysrhythmia     Medications:  Prescriptions prior to admission  Medication Sig Dispense Refill Last Dose  . ARTIFICIAL TEAR OINTMENT OP Apply 1 strip to eye daily as needed (for dryness).   12/24/2013 at Unknown time  . aspirin 81 MG tablet Take 81 mg by mouth daily.   12/24/2013 at Unknown time  . clopidogrel (PLAVIX) 75 MG tablet Take 75 mg by mouth daily.   12/24/2013 at Unknown time  . diazepam (VALIUM) 5 MG tablet Take 1 tablet (5 mg total) by mouth 2 (two) times daily. 12 tablet 0 Past Week at Unknown time  . diclofenac sodium (VOLTAREN) 1 % GEL Apply 2 g topically 4 (four) times daily. Apply to shoulder as directed 100 g 0 Past Week at Unknown time  . HYDROcodone-acetaminophen (NORCO/VICODIN) 5-325 MG per tablet Take 1-2 tablets by mouth every 6 hours as needed for pain. 15 tablet 0 Past Week at Unknown time  . ibuprofen (ADVIL,MOTRIN) 200 MG tablet Take 800 mg by mouth every 6 (six) hours as needed for  moderate pain (pain).   12/23/2013 at Unknown time  . lisinopril (PRINIVIL,ZESTRIL) 10 MG tablet Take 10 mg by mouth daily.   12/24/2013 at Unknown time  . meclizine (ANTIVERT) 25 MG tablet Take 1 tablet (25 mg total) by mouth 4 (four) times daily. 28 tablet 0 Past Month at Unknown time  . Multiple Vitamin (MULTIVITAMIN WITH MINERALS) TABS tablet Take 1 tablet by mouth daily.   12/23/2013 at Unknown time  . pantoprazole (PROTONIX) 40 MG tablet Take 40 mg by mouth daily.   12/24/2013 at Unknown time  . prednisoLONE acetate (PRED FORTE) 1 % ophthalmic suspension Place 1 drop into the right eye 3 (three) times daily.   12/24/2013 at Unknown time  . cephALEXin (KEFLEX) 500 MG capsule Take 1 capsule (500 mg total) by mouth 4 (four) times daily. (Patient not taking: Reported on 12/24/2013) 20 capsule 0 Completed Course at Unknown time  . ondansetron (ZOFRAN ODT) 8 MG disintegrating tablet Take 1 tablet (8 mg total) by mouth every 8 (eight) hours as needed for nausea or vomiting. 20 tablet 0 unknown at unknown time  . oxyCODONE-acetaminophen (PERCOCET/ROXICET) 5-325 MG per tablet Take 1 tablet by mouth every 4 (  four) hours as needed for moderate pain or severe pain. 15 tablet 0 unknown at unknown time  . predniSONE (DELTASONE) 20 MG tablet Take 2 tablets (40 mg total) by mouth daily. (Patient not taking: Reported on 12/24/2013) 10 tablet 0 Completed Course at Unknown time   Scheduled:  . aspirin  324 mg Oral NOW   Or  . aspirin  300 mg Rectal NOW  . aspirin EC  81 mg Oral Daily  . atorvastatin  40 mg Oral q1800  . clopidogrel  75 mg Oral Daily  . diazepam  5 mg Oral BID  . heparin  2,000 Units Intravenous Once  . lisinopril  10 mg Oral Daily  . meclizine  25 mg Oral QID  . metoprolol tartrate  12.5 mg Oral BID  . multivitamin with minerals  1 tablet Oral Daily  . nicotine  14 mg Transdermal Daily  . pantoprazole  40 mg Oral Daily  . pneumococcal 23 valent vaccine  0.5 mL Intramuscular  Tomorrow-1000  . regadenoson      . sodium chloride  3 mL Intravenous Q12H     Assessment: 58yo male admitted on 12/24/13 with c/o CP/tightness x3d, initial troponin negative, to continue heparin. HL is now SUBtherapeutic at 0.18 after one therapeutic level. Per nurse, only brief (~10 min) interruption in gtt this am not likely to have a significant effect. CBC wnl and stable with no reported s/s bleeding.   Goal of Therapy:  Heparin level 0.3-0.7 units/ml Monitor platelets by anticoagulation protocol: Yes   Plan:  - Re-bolus heparin 2000 units x 1 - Increase heparin gtt to 1150 u/hr (~3-4 u/kg/hr increase) - HL in 6 hours - Daily HL/CBC - F/u plans for cath  [facility] LL[facility]rika K. Bonnye FavaNicolsen, PharmD Clinical Pharmacist - Resident Pager: [phone] Pharmacy: [phone] 12/25/2013 2:50 PM

## 2014-03-03 ENCOUNTER — Other Ambulatory Visit (HOSPITAL_COMMUNITY): Payer: Self-pay | Admitting: Internal Medicine

## 2014-03-03 ENCOUNTER — Other Ambulatory Visit: Payer: Self-pay | Admitting: Internal Medicine

## 2014-03-03 ENCOUNTER — Ambulatory Visit (HOSPITAL_COMMUNITY)
Admission: RE | Admit: 2014-03-03 | Discharge: 2014-03-03 | Disposition: A | Discharge status: Home / Self Care | Payer: Medicaid Other | Source: Ambulatory Visit | Attending: Internal Medicine | Admitting: Internal Medicine

## 2014-03-03 DIAGNOSIS — M179 Osteoarthritis of knee, unspecified: Secondary | ICD-10-CM | POA: Insufficient documentation

## 2014-03-03 DIAGNOSIS — R42 Dizziness and giddiness: Secondary | ICD-10-CM

## 2014-03-03 DIAGNOSIS — M25562 Pain in left knee: Secondary | ICD-10-CM | POA: Diagnosis present

## 2014-03-03 DIAGNOSIS — M2550 Pain in unspecified joint: Secondary | ICD-10-CM

## 2014-03-03 DIAGNOSIS — M25552 Pain in left hip: Secondary | ICD-10-CM | POA: Diagnosis not present

## 2014-03-03 DIAGNOSIS — M898X8 Other specified disorders of bone, other site: Secondary | ICD-10-CM | POA: Diagnosis not present

## 2014-03-03 DIAGNOSIS — G8929 Other chronic pain: Secondary | ICD-10-CM | POA: Insufficient documentation

## 2014-03-08 ENCOUNTER — Ambulatory Visit (HOSPITAL_BASED_OUTPATIENT_CLINIC_OR_DEPARTMENT_OTHER): Payer: Medicaid Other | Attending: Internal Medicine

## 2014-03-09 ENCOUNTER — Encounter (HOSPITAL_COMMUNITY): Payer: Self-pay | Admitting: Emergency Medicine

## 2014-03-09 ENCOUNTER — Emergency Department (HOSPITAL_COMMUNITY): Payer: Medicaid Other

## 2014-03-09 ENCOUNTER — Emergency Department (HOSPITAL_COMMUNITY)
Admission: EM | Admit: 2014-03-09 | Discharge: 2014-03-09 | Disposition: A | Discharge status: Home / Self Care | Payer: Medicaid Other | Attending: Emergency Medicine | Admitting: Emergency Medicine

## 2014-03-09 DIAGNOSIS — Z720 Tobacco use: Secondary | ICD-10-CM | POA: Insufficient documentation

## 2014-03-09 DIAGNOSIS — M1990 Unspecified osteoarthritis, unspecified site: Secondary | ICD-10-CM | POA: Diagnosis not present

## 2014-03-09 DIAGNOSIS — Z9889 Other specified postprocedural states: Secondary | ICD-10-CM | POA: Diagnosis not present

## 2014-03-09 DIAGNOSIS — Z7902 Long term (current) use of antithrombotics/antiplatelets: Secondary | ICD-10-CM | POA: Insufficient documentation

## 2014-03-09 DIAGNOSIS — Y998 Other external cause status: Secondary | ICD-10-CM | POA: Diagnosis not present

## 2014-03-09 DIAGNOSIS — Y9289 Other specified places as the place of occurrence of the external cause: Secondary | ICD-10-CM | POA: Diagnosis not present

## 2014-03-09 DIAGNOSIS — Z8719 Personal history of other diseases of the digestive system: Secondary | ICD-10-CM | POA: Insufficient documentation

## 2014-03-09 DIAGNOSIS — Z791 Long term (current) use of non-steroidal anti-inflammatories (NSAID): Secondary | ICD-10-CM | POA: Insufficient documentation

## 2014-03-09 DIAGNOSIS — Z7982 Long term (current) use of aspirin: Secondary | ICD-10-CM | POA: Insufficient documentation

## 2014-03-09 DIAGNOSIS — I2510 Atherosclerotic heart disease of native coronary artery without angina pectoris: Secondary | ICD-10-CM | POA: Diagnosis not present

## 2014-03-09 DIAGNOSIS — Z79899 Other long term (current) drug therapy: Secondary | ICD-10-CM | POA: Insufficient documentation

## 2014-03-09 DIAGNOSIS — J45909 Unspecified asthma, uncomplicated: Secondary | ICD-10-CM | POA: Insufficient documentation

## 2014-03-09 DIAGNOSIS — Z8669 Personal history of other diseases of the nervous system and sense organs: Secondary | ICD-10-CM | POA: Insufficient documentation

## 2014-03-09 DIAGNOSIS — W228XXA Striking against or struck by other objects, initial encounter: Secondary | ICD-10-CM | POA: Insufficient documentation

## 2014-03-09 DIAGNOSIS — S62660A Nondisplaced fracture of distal phalanx of right index finger, initial encounter for closed fracture: Secondary | ICD-10-CM | POA: Diagnosis not present

## 2014-03-09 DIAGNOSIS — S62639A Displaced fracture of distal phalanx of unspecified finger, initial encounter for closed fracture: Secondary | ICD-10-CM

## 2014-03-09 DIAGNOSIS — S65500A Unspecified injury of blood vessel of right index finger, initial encounter: Secondary | ICD-10-CM | POA: Diagnosis present

## 2014-03-09 DIAGNOSIS — I10 Essential (primary) hypertension: Secondary | ICD-10-CM | POA: Diagnosis not present

## 2014-03-09 DIAGNOSIS — Y9389 Activity, other specified: Secondary | ICD-10-CM | POA: Diagnosis not present

## 2014-03-09 MED ORDER — ONDANSETRON 4 MG PO TBDP
4.0000 mg | ORAL_TABLET | Freq: Once | ORAL | Status: AC
  Administered 2014-03-09: 4 mg via ORAL
  Filled 2014-03-09: qty 1

## 2014-03-09 MED ORDER — OXYCODONE-ACETAMINOPHEN 5-325 MG PO TABS: 1.0000 | ORAL_TABLET | ORAL | Status: DC | PRN

## 2014-03-09 MED ORDER — CEPHALEXIN 500 MG PO CAPS: 500.0000 mg | ORAL_CAPSULE | Freq: Three times a day (TID) | ORAL | Status: DC

## 2014-03-09 MED ORDER — HYDROCODONE-ACETAMINOPHEN 5-325 MG PO TABS: 1.0000 | ORAL_TABLET | ORAL | Status: DC | PRN

## 2014-03-09 MED ORDER — ONDANSETRON HCL 4 MG/2ML IJ SOLN: 4.0000 mg | Freq: Once | INTRAMUSCULAR | Status: DC

## 2014-03-09 MED ORDER — HYDROCODONE-ACETAMINOPHEN 5-325 MG PO TABS
2.0000 | ORAL_TABLET | Freq: Once | ORAL | Status: AC
  Administered 2014-03-09: 2 via ORAL
  Filled 2014-03-09: qty 2

## 2014-03-09 NOTE — ED Notes (Signed)
Pt presents to ED for right index injury, hit with a hammer x2 days ago. Right nail blue underneath. Pt states has visual problems due to retina detachment, [facility]

## 2014-03-09 NOTE — ED Provider Notes (Signed)
CSN: [id number]     Arrival date & time 03/09/14  1743 History  This chart was scribed for non-physician practitioner, Arthor Captain PA,  working with [name], MD, by Tanda Rockers, ED Scribe. This patient was seen in room TR10C/TR10C and the patient's care was started at 7:11 PM.    Chief Complaint  Patient presents with  . Finger Injury    The history is provided by the patient and the spouse. No language interpreter was used.     HPI Comments: Danny Conner is a 59 y.o. male who presents to the Emergency Department complaining of right index finger pain that began approximately 2 days ago. Pt reports that he was using a hammer two days ago and hit his finger with the hammer. He complains of increased pain, tenderness, and swelling to the area. Pt complains of a throbbing sensation to the finger as well. He states that he has been unable to sleep due to the pain, causing him to come to the ED. Wife reports that the finger has been turning white in color. He denies numbness or weakness to the area or any other symptoms.   Past Medical History  Diagnosis Date  . Hypertension   . Asthma   . Coronary artery disease   . Irregular heart beat   . Stomach ulcer   . Retinal detachment     right eye July 2014  . Arthritis   . Bursitis   . Gout   . Dysrhythmia    Past Surgical History  Procedure Laterality Date  . Cardiovascular stress test    . Appendectomy    . Eye surgery     History reviewed. No pertinent family history. History  Substance Use Topics  . Smoking status: Current Every Day Smoker -- 0.50 packs/day    Types: Cigarettes  . Smokeless tobacco: Not on file  . Alcohol Use: 1.2 oz/week    1 Cans of beer, 1 Shots of liquor per week     Comment: daily     Review of Systems  Musculoskeletal: Positive for joint swelling.       Right index pain, tenderness, and swelling.   Skin: Positive for color change and wound.      Allergies  Codeine  Home Medications    Prior to Admission medications   Medication Sig Start Date End Date Taking? Authorizing Provider  ARTIFICIAL TEAR OINTMENT OP Apply 1 strip to eye daily as needed (for dryness).    Historical Provider, MD  aspirin 81 MG tablet Take 81 mg by mouth daily.    Historical Provider, MD  atorvastatin (LIPITOR) 40 MG tablet Take 1 tablet (40 mg total) by mouth daily at 6 PM. 12/25/13   Ricki Rodriguez, MD  cephALEXin (KEFLEX) 500 MG capsule Take 1 capsule (500 mg total) by mouth 3 (three) times daily. 03/09/14   Arthor Captain, PA-C  clopidogrel (PLAVIX) 75 MG tablet Take 75 mg by mouth daily.    Historical Provider, MD  diazepam (VALIUM) 5 MG tablet Take 1 tablet (5 mg total) by mouth 2 (two) times daily. 11/28/13   Antony Madura, PA-C  diclofenac sodium (VOLTAREN) 1 % GEL Apply 2 g topically 4 (four) times daily. Apply to shoulder as directed 11/28/13   Antony Madura, PA-C  HYDROcodone-acetaminophen (NORCO) 5-325 MG per tablet Take 1 tablet by mouth every 4 (four) hours as needed. 03/09/14   Arthor Captain, PA-C  lisinopril (PRINIVIL,ZESTRIL) 10 MG tablet Take 10 mg by mouth daily.  Historical Provider, MD  meclizine (ANTIVERT) 25 MG tablet Take 1 tablet (25 mg total) by mouth 4 (four) times daily. 07/29/13   Hilario Quarryanielle S Ray, MD  metoprolol tartrate (LOPRESSOR) 25 MG tablet Take 1 tablet (25 mg total) by mouth 2 (two) times daily. 12/25/13   Ricki RodriguezAjay S Kadakia, MD  Multiple Vitamin (MULTIVITAMIN WITH MINERALS) TABS tablet Take 1 tablet by mouth daily.    Historical Provider, MD  nicotine (NICODERM CQ - DOSED IN MG/24 HOURS) 14 mg/24hr patch Place 1 patch (14 mg total) onto the skin daily. 12/25/13   Ricki RodriguezAjay S Kadakia, MD  nitroGLYCERIN (NITROSTAT) 0.4 MG SL tablet Place 1 tablet (0.4 mg total) under the tongue every 5 (five) minutes x 3 doses as needed for chest pain. 12/25/13   Ricki RodriguezAjay S Kadakia, MD  oxyCODONE-acetaminophen (PERCOCET) 5-325 MG per tablet Take 1-2 tablets by mouth every 4 (four) hours as needed. 03/09/14    Arthor CaptainAbigail Kalif Kattner, PA-C  pantoprazole (PROTONIX) 40 MG tablet Take 40 mg by mouth daily.    Historical Provider, MD  prednisoLONE acetate (PRED FORTE) 1 % ophthalmic suspension Place 1 drop into the right eye 3 (three) times daily.    Historical Provider, MD   Triage Vitals: BP 134/93 mmHg  Pulse 71  Temp(Src) 98.1 F (36.7 C) (Oral)  Resp 18  SpO2 97%   Physical Exam  Constitutional: He is oriented to person, place, and time. He appears well-developed and well-nourished. No distress.  HENT:  Head: Normocephalic and atraumatic.  Eyes: Conjunctivae and EOM are normal.  Neck: Neck supple. No tracheal deviation present.  Cardiovascular: Normal rate, regular rhythm and normal heart sounds.   Pulmonary/Chest: Effort normal and breath sounds normal. No respiratory distress.  Musculoskeletal: Normal range of motion.  Swelling right first distal index finger. Subungual hematoma. Tender to palpation. Blood blister on medial side. Sensation intact. Cap refill < 2 seconds.   Neurological: He is alert and oriented to person, place, and time.  Skin: Skin is warm and dry.  Psychiatric: He has a normal mood and affect. His behavior is normal.  Nursing note and vitals reviewed.   ED Course  Procedures (including critical care time)  7:48 PM - Eye cautery to right index fingernail.   DIAGNOSTIC STUDIES: Oxygen Saturation is 97% on RA, normal by my interpretation.    COORDINATION OF CARE: 7:15 PM-Discussed treatment plan which includes finger X Ray, pain medication, and eye cautery with pt at bedside and pt agreed to plan.   Labs Review Labs Reviewed - No data to display  Imaging Review Dg Finger Index Right  03/09/2014   CLINICAL DATA:  Hit end of right index finger with hammer, with distal right second finger pain and swelling. Initial encounter.  EXAM: RIGHT INDEX FINGER 2+V  COMPARISON:  Right third finger radiographs performed 12/03/2013  FINDINGS: There is a mildly comminuted fracture  involving the distal tuft of the second distal phalanx. No additional fractures are seen. Associated soft tissue disruption is difficult to fully assess on radiograph. Visualized joint spaces are preserved.  IMPRESSION: Mildly comminuted fracture involving the distal tuft of the second distal phalanx.   Electronically Signed   By: Roanna RaiderJeffery  Chang M.D.   On: 03/09/2014 19:45     EKG Interpretation None      MDM   Final diagnoses:  Closed fracture of tuft of distal phalanx of finger, initial encounter   Patient with subungal hematoma. Trephinated with relief. Nondisplaced tuft fracture. 2. Supportive care at home.  Follow up with orthopedics. Discussed return precautions. I personally performed the services described in this documentation, which was scribed in my presence. The recorded information has been reviewed and is accurate.       Arthor Captain, PA-C 03/10/14 1610  [name], MD 03/10/14 [phone]

## 2014-03-09 NOTE — Discharge Instructions (Signed)
Fingertip Injuries and Amputations °Fingertip injuries are common and often get injured because they are last to escape when pulling your hand out of harm's way. You have amputated (cut off) part of your finger. How this turns out depends largely on how much was amputated. If just the tip is amputated, often the end of the finger will grow back and the finger may return to much the same as it was before the injury.  °If more of the finger is missing, your caregiver has done the best with the tissue remaining to allow you to keep as much finger as is possible. Your caregiver after checking your injury has tried to leave you with a painless fingertip that has durable, feeling skin. If possible, your caregiver has tried to maintain the finger's length and appearance and preserve its fingernail.  °Please read the instructions outlined below and refer to this sheet in the next few weeks. These instructions provide you with general information on caring for yourself. Your caregiver may also give you specific instructions. While your treatment has been done according to the most current medical practices available, unavoidable complications occasionally occur. If you have any problems or questions after discharge, please call your caregiver. °HOME CARE INSTRUCTIONS  °· You may resume normal diet and activities as directed or allowed. °· Keep your hand elevated above the level of your heart. This helps decrease pain and swelling. °· Keep ice packs (or a bag of ice wrapped in a towel) on the injured area for 15-20 minutes, 03-04 times per day, for the first two days. °· Change dressings if necessary or as directed. °· Clean the wound daily or as directed. °· Only take over-the-counter or prescription medicines for pain, discomfort, or fever as directed by your caregiver. °· Keep appointments as directed. °SEEK IMMEDIATE MEDICAL CARE IF: °· You develop redness, swelling, numbness or increasing pain in the wound. °· There is  pus coming from the wound. °· You develop an unexplained oral temperature above 102° F (38.9° C) or as your caregiver suggests. °· There is a foul (bad) smell coming from the wound or dressing. °· There is a breaking open of the wound (edges not staying together) after sutures or staples have been removed. °MAKE SURE YOU:  °· Understand these instructions. °· Will watch your condition. °· Will get help right away if you are not doing well or get worse. °Document Released: 11/13/2004 Document Revised: 03/17/2011 Document Reviewed: 10/13/2007 °ExitCare® Patient Information ©2015 [employer]. This information is not intended to replace advice given to you by your health care provider. Make sure you discuss any questions you have with your health care provider. ° °

## 2014-05-26 ENCOUNTER — Encounter (HOSPITAL_COMMUNITY): Payer: Self-pay | Admitting: Emergency Medicine

## 2014-05-26 ENCOUNTER — Emergency Department (HOSPITAL_COMMUNITY)
Admission: EM | Admit: 2014-05-26 | Discharge: 2014-05-26 | Disposition: A | Discharge status: Home / Self Care | Payer: Medicaid Other | Source: Home / Self Care | Attending: Family Medicine | Admitting: Family Medicine

## 2014-05-26 DIAGNOSIS — M7552 Bursitis of left shoulder: Secondary | ICD-10-CM

## 2014-05-26 DIAGNOSIS — M1611 Unilateral primary osteoarthritis, right hip: Secondary | ICD-10-CM | POA: Diagnosis not present

## 2014-05-26 DIAGNOSIS — M7061 Trochanteric bursitis, right hip: Secondary | ICD-10-CM | POA: Diagnosis not present

## 2014-05-26 MED ORDER — METHYLPREDNISOLONE ACETATE 40 MG/ML IJ SUSP
INTRAMUSCULAR | Status: AC
  Filled 2014-05-26: qty 1

## 2014-05-26 MED ORDER — NAPROXEN 375 MG PO TABS: 375.0000 mg | ORAL_TABLET | Freq: Two times a day (BID) | ORAL | Status: DC

## 2014-05-26 MED ORDER — METHYLPREDNISOLONE ACETATE 40 MG/ML IJ SUSP
40.0000 mg | Freq: Once | INTRAMUSCULAR | Status: AC
  Administered 2014-05-26: 40 mg via INTRA_ARTICULAR

## 2014-05-26 NOTE — ED Notes (Signed)
C/o arthritis pain on right side of hip, left shoulder and elbow pain x2 weeks Alert, no signs of acute distress.

## 2014-05-26 NOTE — Discharge Instructions (Signed)
Thank you for coming in today. Call or go to the ER if you develop a large red swollen joint with extreme pain or oozing puss.  Follow up with your doctor.  Return as needed.  Do the exercises we talked about.   Hip Bursitis Bursitis is a swelling and soreness (inflammation) of a fluid-filled sac (bursa). This sac overlies and protects the joints.  CAUSES   Injury.  Overuse of the muscles surrounding the joint.  Arthritis.  Gout.  Infection.  Cold weather.  Inadequate warm-up and conditioning prior to activities. The cause may not be known.  SYMPTOMS   Mild to severe irritation.  Tenderness and swelling over the outside of the hip.  Pain with motion of the hip.  If the bursa becomes infected, a fever may be present. Redness, tenderness, and warmth will develop over the hip. Symptoms usually lessen in 3 to 4 weeks with treatment, but can come back. TREATMENT If conservative treatment does not work, your caregiver may advise draining the bursa and injecting cortisone into the area. This may speed up the healing process. This may also be used as an initial treatment of choice. HOME CARE INSTRUCTIONS   Apply ice to the affected area for 15-20 minutes every 3 to 4 hours while awake for the first 2 days. Put the ice in a plastic bag and place a towel between the bag of ice and your skin.  Rest the painful joint as much as possible, but continue to put the joint through a normal range of motion at least 4 times per day. When the pain lessens, begin normal, slow movements and usual activities to help prevent stiffness of the hip.  Only take over-the-counter or prescription medicines for pain, discomfort, or fever as directed by your caregiver.  Use crutches to limit weight bearing on the hip joint, if advised.  Elevate your painful hip to reduce swelling. Use pillows for propping and cushioning your legs and hips.  Gentle massage may provide comfort and decrease  swelling. SEEK IMMEDIATE MEDICAL CARE IF:   Your pain increases even during treatment, or you are not improving.  You have a fever.  You have heat and inflammation over the involved bursa.  You have any other questions or concerns. MAKE SURE YOU:   Understand these instructions.  Will watch your condition.  Will get help right away if you are not doing well or get worse. Document Released: 06/14/2001 Document Revised: 03/17/2011 Document Reviewed: 01/12/2008 [facility] Patient Information 2015 [place] [place]. This information is not intended to replace advice given to you by your health care provider. Make sure you discuss any questions you have with your health care provider.

## 2014-05-26 NOTE — ED Provider Notes (Signed)
Danny Conner is a 59 y.o. male who presents to Urgent Care today for multiple pain complaints, 1) right lateral hip pain. Pain is been ongoing for years but worse over the last 2 weeks without injury. The pain is located in the lateral hip is worse with laying on that side stated from a seated position and climbing stairs. He's tried over-the-counter medications for pain which have helped only a little. He's had injections in the past been diagnosed in the past with trochanteric bursitis. 2) left groin pain again present for years but worse recently. Pain is worse with motion and hip flexion. No radiating pain weakness or numbness.  3) left shoulder pain. Pain present for years worse recently worse with overhead motion reaching back. No radiating pain weakness or numbness.  No injuries to explain his pain.   Past Medical History  Diagnosis Date  . Hypertension   . Asthma   . Coronary artery disease   . Irregular heart beat   . Stomach ulcer   . Retinal detachment     right eye July 2014  . Arthritis   . Bursitis   . Gout   . Dysrhythmia    Past Surgical History  Procedure Laterality Date  . Cardiovascular stress test    . Appendectomy    . Eye surgery     History  Substance Use Topics  . Smoking status: Current Every Day Smoker -- 0.50 packs/day    Types: Cigarettes  . Smokeless tobacco: Not on file  . Alcohol Use: 1.2 oz/week    1 Cans of beer, 1 Shots of liquor per week     Comment: daily    ROS as above Medications: No current facility-administered medications for this encounter.   Current Outpatient Prescriptions  Medication Sig Dispense Refill  . aspirin 81 MG tablet Take 81 mg by mouth daily.    . clopidogrel (PLAVIX) 75 MG tablet Take 75 mg by mouth daily.    Marland Kitchen. lisinopril (PRINIVIL,ZESTRIL) 10 MG tablet Take 10 mg by mouth daily.    . pantoprazole (PROTONIX) 40 MG tablet Take 40 mg by mouth daily.    . ARTIFICIAL TEAR OINTMENT OP Apply 1 strip to eye daily as needed  (for dryness).    Marland Kitchen. atorvastatin (LIPITOR) 40 MG tablet Take 1 tablet (40 mg total) by mouth daily at 6 PM. 30 tablet 3  . cephALEXin (KEFLEX) 500 MG capsule Take 1 capsule (500 mg total) by mouth 3 (three) times daily. 21 capsule 0  . diazepam (VALIUM) 5 MG tablet Take 1 tablet (5 mg total) by mouth 2 (two) times daily. 12 tablet 0  . diclofenac sodium (VOLTAREN) 1 % GEL Apply 2 g topically 4 (four) times daily. Apply to shoulder as directed 100 g 0  . HYDROcodone-acetaminophen (NORCO) 5-325 MG per tablet Take 1 tablet by mouth every 4 (four) hours as needed. 10 tablet 0  . meclizine (ANTIVERT) 25 MG tablet Take 1 tablet (25 mg total) by mouth 4 (four) times daily. 28 tablet 0  . metoprolol tartrate (LOPRESSOR) 25 MG tablet Take 1 tablet (25 mg total) by mouth 2 (two) times daily. 60 tablet 3  . Multiple Vitamin (MULTIVITAMIN WITH MINERALS) TABS tablet Take 1 tablet by mouth daily.    . nicotine (NICODERM CQ - DOSED IN MG/24 HOURS) 14 mg/24hr patch Place 1 patch (14 mg total) onto the skin daily. 28 patch 0  . nitroGLYCERIN (NITROSTAT) 0.4 MG SL tablet Place 1 tablet (0.4 mg total)  under the tongue every 5 (five) minutes x 3 doses as needed for chest pain. 25 tablet 1  . oxyCODONE-acetaminophen (PERCOCET) 5-325 MG per tablet Take 1-2 tablets by mouth every 4 (four) hours as needed. 10 tablet 0  . prednisoLONE acetate (PRED FORTE) 1 % ophthalmic suspension Place 1 drop into the right eye 3 (three) times daily.     Allergies  Allergen Reactions  . Codeine Nausea And Vomiting     Exam:  BP 126/82 mmHg  Pulse 64  Temp(Src) 97.5 F (36.4 C) (Oral)  Resp 18  SpO2 97% Gen: Well NAD HEENT: EOMI,  MMM Lungs: Normal work of breathing. CTABL Heart: RRR no MRG Abd: NABS, Soft. Nondistended, Nontender Exts: Brisk capillary refill, warm and well perfused.  MSK:  Tender palpation right greater trochanter. Hip range of motion intact in the right. Hip abduction strength significantly diminished  on the right. Left hip normal-appearing nontender significant decreased rotational range of motion with pain with internal rotation. Left shoulder normal-appearing nontender normal range of motion positive impingement testing 4+/5 abduction strength. Positive empty can. Right shoulder normal-appearing nontender negative impingement testing. Normal strength  Right greater trochanteric injection: Consent obtained and timeout performed. Skin cleaned with alcohol cold spray applied and a 25-gauge 1-1/2 inch needle used to access the point of maximal tenderness at the right greater trochanter. 40 mg of Depo-Medrol and 4 mL of lidocaine without epinephrine injected in a wheel pattern. Patient tolerated the procedure well.  X-rays of the pelvis and left shoulder were reviewed in the electronic medical record  No results found for this or any previous visit (from the past 24 hour(s)). No results found.  Assessment and Plan: 59 y.o. male with  1) right greater trochanter: Status post injection. Discussed hip abduction strengthening exercises including side leg raises. Follow-up with PCP 2) left hip pain: Due to DJD. Follow-up with PCP. Naproxen pain 3) left shoulder pain likely due to impingement bursitis. He may return to clinic for repeat injection in the near future or ideally should follow-up with PCP. Naproxen for pain  Discussed warning signs or symptoms. Please see discharge instructions. Patient expresses understanding.     Rodolph BongEvan S Yuya Vanwingerden, MD 05/26/14 [phone]

## 2014-11-11 ENCOUNTER — Encounter (HOSPITAL_COMMUNITY): Payer: Self-pay

## 2014-11-11 ENCOUNTER — Emergency Department (HOSPITAL_COMMUNITY)
Admission: EM | Admit: 2014-11-11 | Discharge: 2014-11-11 | Disposition: A | Discharge status: Home / Self Care | Payer: Medicaid Other | Attending: Emergency Medicine | Admitting: Emergency Medicine

## 2014-11-11 DIAGNOSIS — S0502XA Injury of conjunctiva and corneal abrasion without foreign body, left eye, initial encounter: Secondary | ICD-10-CM | POA: Diagnosis not present

## 2014-11-11 DIAGNOSIS — I2510 Atherosclerotic heart disease of native coronary artery without angina pectoris: Secondary | ICD-10-CM | POA: Diagnosis not present

## 2014-11-11 DIAGNOSIS — Z7902 Long term (current) use of antithrombotics/antiplatelets: Secondary | ICD-10-CM | POA: Diagnosis not present

## 2014-11-11 DIAGNOSIS — Y9289 Other specified places as the place of occurrence of the external cause: Secondary | ICD-10-CM | POA: Diagnosis not present

## 2014-11-11 DIAGNOSIS — Z792 Long term (current) use of antibiotics: Secondary | ICD-10-CM | POA: Insufficient documentation

## 2014-11-11 DIAGNOSIS — I499 Cardiac arrhythmia, unspecified: Secondary | ICD-10-CM | POA: Insufficient documentation

## 2014-11-11 DIAGNOSIS — Z79899 Other long term (current) drug therapy: Secondary | ICD-10-CM | POA: Diagnosis not present

## 2014-11-11 DIAGNOSIS — X58XXXA Exposure to other specified factors, initial encounter: Secondary | ICD-10-CM | POA: Insufficient documentation

## 2014-11-11 DIAGNOSIS — J45909 Unspecified asthma, uncomplicated: Secondary | ICD-10-CM | POA: Insufficient documentation

## 2014-11-11 DIAGNOSIS — M1990 Unspecified osteoarthritis, unspecified site: Secondary | ICD-10-CM | POA: Insufficient documentation

## 2014-11-11 DIAGNOSIS — Y9389 Activity, other specified: Secondary | ICD-10-CM | POA: Insufficient documentation

## 2014-11-11 DIAGNOSIS — Z7982 Long term (current) use of aspirin: Secondary | ICD-10-CM | POA: Insufficient documentation

## 2014-11-11 DIAGNOSIS — Z8719 Personal history of other diseases of the digestive system: Secondary | ICD-10-CM | POA: Diagnosis not present

## 2014-11-11 DIAGNOSIS — I10 Essential (primary) hypertension: Secondary | ICD-10-CM | POA: Diagnosis not present

## 2014-11-11 DIAGNOSIS — M109 Gout, unspecified: Secondary | ICD-10-CM | POA: Insufficient documentation

## 2014-11-11 DIAGNOSIS — Y998 Other external cause status: Secondary | ICD-10-CM | POA: Diagnosis not present

## 2014-11-11 DIAGNOSIS — Z720 Tobacco use: Secondary | ICD-10-CM | POA: Diagnosis not present

## 2014-11-11 DIAGNOSIS — Z791 Long term (current) use of non-steroidal anti-inflammatories (NSAID): Secondary | ICD-10-CM | POA: Insufficient documentation

## 2014-11-11 DIAGNOSIS — H578 Other specified disorders of eye and adnexa: Secondary | ICD-10-CM | POA: Diagnosis present

## 2014-11-11 MED ORDER — IBUPROFEN 800 MG PO TABS: 800.0000 mg | ORAL_TABLET | Freq: Three times a day (TID) | ORAL | Status: DC

## 2014-11-11 MED ORDER — ERYTHROMYCIN 5 MG/GM OP OINT: 1.0000 "application " | TOPICAL_OINTMENT | Freq: Every day | OPHTHALMIC | Status: DC

## 2014-11-11 MED ORDER — POLYMYXIN B-TRIMETHOPRIM 10000-0.1 UNIT/ML-% OP SOLN: 1.0000 [drp] | OPHTHALMIC | Status: DC

## 2014-11-11 MED ORDER — TETRACAINE HCL 0.5 % OP SOLN
1.0000 [drp] | Freq: Once | OPHTHALMIC | Status: AC
  Administered 2014-11-11: 1 [drp] via OPHTHALMIC
  Filled 2014-11-11: qty 2

## 2014-11-11 MED ORDER — FLUORESCEIN SODIUM 1 MG OP STRP
1.0000 | ORAL_STRIP | Freq: Once | OPHTHALMIC | Status: AC
  Administered 2014-11-11: 1 via OPHTHALMIC
  Filled 2014-11-11: qty 1

## 2014-11-11 NOTE — Discharge Instructions (Signed)
1. Medications: erythromycin ointment at night, polytrim drops during the day, usual home medications 2. Treatment: rest, drink plenty of fluids 3. Follow Up: please followup with your ophthalmologist in 2-3 for discussion of your diagnoses and further evaluation after today's visit; if you do not have a primary care doctor use the resource guide provided to find one; please return to the ER for severe pain, vision changes, new or worsening symptoms   Corneal Abrasion The cornea is the clear covering at the front and center of the eye. When you look at the colored portion of the eye, you are looking through the cornea. It is a thin tissue made up of layers. The top layer is the most sensitive layer. A corneal abrasion happens if this layer is scratched or an injury causes it to come off.  HOME CARE  You may be given drops or a medicated cream. Use the medicine as told by your doctor.  A pressure patch may be put over the eye. If this is done, follow your doctor's instructions for when to remove the patch. Do not drive or use machines while the eye patch is on. Judging distances is hard to do with a patch on.  See your doctor for a follow-up exam if you are told to do so. It is very important that you keep this appointment. GET HELP IF:   You have pain, are sensitive to light, and have a scratchy feeling in one eye or both eyes.  Your pressure patch keeps getting loose. You can blink your eye under the patch.  You have fluid coming from your eye or the lids stick together in the morning.  You have the same symptoms in the morning that you did with the first abrasion. This could be days, weeks, or months after the first abrasion healed.   This information is not intended to replace advice given to you by your health care provider. Make sure you discuss any questions you have with your health care provider.   Document Released: 06/11/2007 Document Revised: 09/13/2014 Document Reviewed:  08/30/2012 Elsevier Interactive Patient Education 2016 [employer]   Emergency Department Resource Guide 1) Find a Doctor and Pay Out of Pocket Although you won't have to find out who is covered by your insurance plan, it is a good idea to ask around and get recommendations. You will then need to call the office and see if the doctor you have chosen will accept you as a new patient and what types of options they offer for patients who are self-pay. Some doctors offer discounts or will set up payment plans for their patients who do not have insurance, but you will need to ask so you aren't surprised when you get to your appointment.  2) Contact Your Local Health Department Not all health departments have doctors that can see patients for sick visits, but many do, so it is worth a call to see if yours does. If you don't know where your local health department is, you can check in your phone book. The CDC also has a tool to help you locate your state's health department, and many state websites also have listings of all of their local health departments.  3) Find a Walk-in Clinic If your illness is not likely to be very severe or complicated, you may want to try a walk in clinic. These are popping up all over the country in pharmacies, drugstores, and shopping centers. They're usually staffed by nurse practitioners or  physician assistants that have been trained to treat common illnesses and complaints. They're usually fairly quick and inexpensive. However, if you have serious medical issues or chronic medical problems, these are probably not your best option.  No Primary Care Doctor: - Call Health Connect at  [phone] - they can help you locate a primary care doctor that  accepts your insurance, provides certain services, etc. - Physician Referral Service- [phone]  Chronic Pain Problems: Organization         Address  Phone   Notes  [facility]  [phone] Patients  need to be referred by their primary care doctor.   Medication Assistance: Organization         Address  Phone   Notes  [facility] Medication [facility] [address] [place] [place], [place] [address] [phone] --Must be a resident of [facility] -- Must have NO insurance coverage whatsoever (no Medicaid/ Medicare, etc.) -- The pt. MUST have a primary care doctor that directs their care regularly and follows them in the community   [facility]  [phone]   [employer]  [phone]    Agencies that provide inexpensive medical care: Organization         Address  Phone   Notes  [facility]  [phone]   [facility]    [phone]   [facility] [address] [place], [place] [address] [phone]   [facility] [address] [place] [phone]   [facility]anned Parenthood    [phone]   [facility]    [phone]   [facility]  [address], [place] Phone:  [phone], Fax:  [phone] Hours of Operation:  9 am - 6 pm, M-F.  Also accepts Medicaid/Medicare and self-pay.  [facility]  [address], [place] Phone: [phone], Fax: [phone]. Hours of Operation:  8:30 am - 5:30 pm, M-F.  Also accepts Medicaid and self-pay.  [facility] [place] [address], [place] Phone: [phone]   [facility] [address] Natasha Bence [place], [place] [phone], Ext. [phone] Mondays & Thursdays: 7-9 AM.  First 15 patients are seen on a first come, first serve basis.    Medicaid-accepting [facility] Providers:  Organization         Address  Phone   Notes  [facility] [address], [place] [phone] Also accepts self-pay patients.  [facility] [address] Laurell Josephs [place], [place]  [phone]     [facility] [address], [place] [phone]   [facility] [address] [place] [phone]   Renaye Rakers [address], [place]   [phone] Only accepts [place] patients after they have their name applied to their card.   Self-Pay (no insurance) in [facility]:  Organization         Address  Phone   Notes  Sickle Cell Patients, [facility] Medicine [address] [place], [place] [phone]   [facility] [address] [place], [place] [phone]   [facility] [place]  [address], [place] [phone]   [facility]/Dr. Julio Sicks  [phone]  [place], [place] or [address], [place] [phone] Phone number for both [place] and [place] locations is the same.  [facility] [address] [place] [phone]   [facility] [address] [place] or [address] [phone]   [facility] [address], [place] [phone], phone; [phone], fax Sees patients 1st and 3rd Saturday of every month.  Must not qualify for public or private insurance (i.e. Medicaid, Medicare, [place] Health Choice, Veterans' Benefits)  Household income should be no more than 200% of the poverty level The clinic cannot treat you if you are pregnant or think you are pregnant  Sexually transmitted diseases are not treated at the clinic.    Dental Care: Organization         Address  Phone  Notes  [facility] [address] [place], [place] [phone] Accepts children up to age 52 who are enrolled in [place] or [place] Health Choice; pregnant women with a Medicaid card; and children who have applied for Medicaid or [place] Health Choice, but were declined,  whose parents can pay a reduced fee at time of service.  [facility] of [facility]  [address], [place] [phone] Accepts children up to age 25 who are enrolled in [place] or [place] Health Choice; pregnant women with a Medicaid card; and children who have applied for Medicaid or [place] Health Choice, but were declined, whose parents can pay a reduced fee at time of service.  [facility]  [address] [place], [place] [phone] Patients are seen by appointment only. Walk-ins are not accepted. [facility] will see patients 62 years of age and older. Monday - Tuesday (8am-5pm) Most Wednesdays (8:30-5pm) $30 per visit, cash only  [facility] Adult Dental Access PROGRAM  [address], [facility] [phone] Patients are seen by appointment only. Walk-ins are not accepted. [facility] will see patients 88 years of age and older. One Wednesday Evening (Monthly: Volunteer Based).  $30 per visit, cash only  [employer]  [phone] for adults; Children under age 62, call Graduate Pediatric Dentistry at [phone]. Children aged 24-14, please call [phone] to request a pediatric application.  Dental services are provided in all areas of dental care including fillings, crowns and bridges, complete and partial dentures, implants, gum treatment, root canals, and extractions. Preventive care is also provided. Treatment is provided to both adults and children. Patients are selected via a lottery and there is often a waiting list.   [facility] [address] [place]  [phone] [email or web address]   [facility] [address] [place], [place] [phone], Ext. [phone] Second and Fourth Thursday of each month, opens at 6:30 AM; Clinic ends at 9 AM.  Patients are seen on a first-come first-served basis, and a limited number are seen during each clinic.   [facility]  [address] Ether Griffins [place], [place] [phone]   Eligibility Requirements You must have lived in [place], [place], or [place] counties for at least the last three months.   You cannot be eligible for state or federal sponsored [place], including [employer], [place], or [employer].   You generally cannot be eligible for healthcare insurance through your employer.  How to apply: Eligibility screenings are held every Tuesday and Wednesday afternoon from 1:00 pm until 4:00 pm. You do not need an appointment for the interview!  [facility] [address] [place], [place] [phone]   [employer] Health Department  [phone]   [facility] Health Department  [phone]   [facility] Health Department  [phone]    Behavioral Health Resources in the Community: Intensive Outpatient Programs Organization         Address  Phone  Notes  [facility] Services [address], [place], [place] [phone]   [facility] Outpatient [address], [place], [place] [phone]   [facility] [address], [place], [place]  [phone]   [facility] Mental Health [address]  [place], [place] [phone] or [phone]   Substance Abuse Resources Organization         Address  Phone  Notes  [facility]  [phone]   [facility]  [phone]   [facility]  [phone]   Floydene Flock  [phone]   Residential & Outpatient Substance Abuse Program  [phone]   Psychological Services Organization         Address  Phone  Notes  [facility] Behavioral Health  [phone]   [facility] Services  [phone]   [facility] Mental Health [address] [place] [phone] or [phone]    Mobile Crisis Teams Organization         Address  Phone  Notes  [facility], Mobile Crisis Care Unit   ([phone]   [facility]  [address] [place], [place] [phone]   Doristine Locks [address] [place] [phone]    Self-Help/Support Groups Organization         Address  Phone             Notes  [facility] [place] - variety of support groups  336- I7437963 Call for more information  Narcotics Anonymous (NA), [facility] [address], [employer] [place]  2 meetings at this location   Statistician         Address  Phone  Notes  [facility] [address],    [place]  [phone]   [facility]  [address] [place] [address], [place], [place] [phone]   [facility] [address] [place] [address] [place], [place] [phone] Admissions: 8am-3pm M-F  [facility] [address]    [place], [place] [phone]   [facility] [address] [place], [place], [place] [phone]   [facility] [address],  [place], [place] [phone]   [facility] - Intensive Outpatient [address] Laurell Josephs [address], [place], [place] [phone]   [facility] ([facility]iction [facility] [address] [place]  [place], [place] [phone] or [phone]   [facility] ([facility]) [address] [place], [place] [phone] Accepts Medicaid  [facility] [address],  [place] [phone] Substance Abuse/Addiction Treatment   [facility] Organization         Address  Phone  Notes  [facility]  [phone]   Angie Fava, PhD [address] Ervin Knack [place], [place]   [phone] or [phone]   [facility] Behavioral   [address] [place], [place] [phone]   [facility] [address],  [place], [place] [phone]) [phone] Insurance/Medicaid/sponsorship through [facility]  [facility] [address]                                     [place], [place] [phone]) [phone] Therapy/tele-psych/case  [facility] [address]   [place], [place] [phone]) [phone]    Dr. Lolly MustacheArfeen  [phone](336) [phone]   [facility]  [employer] [facility] 1) [address], [place] 2) [address], [place] 3)  [address], [place] [phone](336) [phone]) [phone]  [phone]   [facility] Child Abuse Hotline [phone](336) [phone] or [phone]) [phone] (After Hours)

## 2014-11-11 NOTE — ED Notes (Signed)
He states he awoke with left eye drainage and itching, which persists.  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

## 2014-11-11 NOTE — ED Provider Notes (Signed)
History  By signing my name below, I, Karle Plumber, attest that this documentation has been prepared under the direction and in the presence of Glean Hess, [place]. Electronically Signed: Karle Plumber, ED Scribe. 11/11/2014. 4:26 PM.  Chief Complaint  Patient presents with  . Eye Problem    The history is provided by the patient and medical records. No language interpreter was used.     HPI Comments:  Danny Conner is a 59 y.o. male who presents to the ED with constant left eye pain, which started this morning. He reports associated tearing and blurry vision. He reports foreign body sensation, and states he feels like sand is in his eye. He reports filleting fresh fish last night and states he may have gotten a fish scale in his eye. He states touching his eye exacerbates his pain. He reports he has tried OTC eye drops for symptom relief, which have not been effective. He denies fever, chills, discharge. He denies contact lens wear.   Past Medical History  Diagnosis Date  . Hypertension   . Asthma   . Coronary artery disease   . Irregular heart beat   . Stomach ulcer   . Retinal detachment     right eye July 2014  . Arthritis   . Bursitis   . Gout   . Dysrhythmia    Past Surgical History  Procedure Laterality Date  . Cardiovascular stress test    . Appendectomy    . Eye surgery     No family history on file. Social History  Substance Use Topics  . Smoking status: Current Every Day Smoker -- 0.50 packs/day    Types: Cigarettes  . Smokeless tobacco: None  . Alcohol Use: 1.2 oz/week    1 Cans of beer, 1 Shots of liquor per week     Comment: daily       Review of Systems  Constitutional: Negative for fever and chills.  Eyes: Positive for photophobia, pain, redness and visual disturbance. Negative for discharge and itching.  Neurological: Negative for dizziness, light-headedness and headaches.    Allergies  Codeine  Home Medications   Prior to Admission  medications   Medication Sig Start Date End Date Taking? Authorizing Provider  ARTIFICIAL TEAR OINTMENT OP Apply 1 strip to eye daily as needed (for dryness).    Historical Provider, MD  aspirin 81 MG tablet Take 81 mg by mouth daily.    Historical Provider, MD  atorvastatin (LIPITOR) 40 MG tablet Take 1 tablet (40 mg total) by mouth daily at 6 PM. 12/25/13   Orpah Cobb, MD  cephALEXin (KEFLEX) 500 MG capsule Take 1 capsule (500 mg total) by mouth 3 (three) times daily. 03/09/14   Arthor Captain, PA-C  clopidogrel (PLAVIX) 75 MG tablet Take 75 mg by mouth daily.    Historical Provider, MD  diazepam (VALIUM) 5 MG tablet Take 1 tablet (5 mg total) by mouth 2 (two) times daily. 11/28/13   Antony Madura, PA-C  diclofenac sodium (VOLTAREN) 1 % GEL Apply 2 g topically 4 (four) times daily. Apply to shoulder as directed 11/28/13   Antony Madura, PA-C  erythromycin ophthalmic ointment Place 1 application into the left eye at bedtime. Place 1/2 inch ribbon of ointment in the affected eye at night 11/11/14   Mady Gemma, PA-C  HYDROcodone-acetaminophen [facility]) 5-325 MG per tablet Take 1 tablet by mouth every 4 (four) hours as needed. 03/09/14   Arthor Captain, PA-C  ibuprofen (ADVIL,MOTRIN) 800 MG tablet Take 1  tablet (800 mg total) by mouth 3 (three) times daily. 11/11/14   Mady GemmaElizabeth C Westfall, PA-C  lisinopril (PRINIVIL,ZESTRIL) 10 MG tablet Take 10 mg by mouth daily.    Historical Provider, MD  meclizine (ANTIVERT) 25 MG tablet Take 1 tablet (25 mg total) by mouth 4 (four) times daily. 07/29/13   Margarita Grizzleanielle Ray, MD  metoprolol tartrate (LOPRESSOR) 25 MG tablet Take 1 tablet (25 mg total) by mouth 2 (two) times daily. 12/25/13   Orpah CobbAjay Kadakia, MD  Multiple Vitamin (MULTIVITAMIN WITH MINERALS) TABS tablet Take 1 tablet by mouth daily.    Historical Provider, MD  naproxen (NAPROSYN) 375 MG tablet Take 1 tablet (375 mg total) by mouth 2 (two) times daily. 05/26/14   Rodolph BongEvan S Corey, MD  nicotine (NICODERM CQ - DOSED  IN MG/24 HOURS) 14 mg/24hr patch Place 1 patch (14 mg total) onto the skin daily. 12/25/13   Orpah CobbAjay Kadakia, MD  nitroGLYCERIN (NITROSTAT) 0.4 MG SL tablet Place 1 tablet (0.4 mg total) under the tongue every 5 (five) minutes x 3 doses as needed for chest pain. 12/25/13   Orpah CobbAjay Kadakia, MD  oxyCODONE-acetaminophen (PERCOCET) 5-325 MG per tablet Take 1-2 tablets by mouth every 4 (four) hours as needed. 03/09/14   Arthor CaptainAbigail Harris, PA-C  pantoprazole (PROTONIX) 40 MG tablet Take 40 mg by mouth daily.    Historical Provider, MD  prednisoLONE acetate (PRED FORTE) 1 % ophthalmic suspension Place 1 drop into the right eye 3 (three) times daily.    Historical Provider, MD  trimethoprim-polymyxin b (POLYTRIM) ophthalmic solution Place 1 drop into the left eye every 4 (four) hours. 11/11/14   Mady GemmaElizabeth C Westfall, PA-C    Triage Vitals: BP 130/87 mmHg  Pulse 69  Temp(Src) 98.4 F (36.9 C) (Oral)  Resp 18  SpO2 97% Physical Exam  Constitutional: He is oriented to person, place, and time. He appears well-developed and well-nourished. No distress.  HENT:  Head: Normocephalic and atraumatic.  Right Ear: External ear normal.  Left Ear: External ear normal.  Nose: Nose normal.  Mouth/Throat: Oropharynx is clear and moist.  Eyes: EOM are normal. Pupils are equal, round, and reactive to light. Right eye exhibits no discharge and no exudate. No foreign body present in the right eye. Left eye exhibits no discharge and no exudate. No foreign body present in the left eye. Right conjunctiva is not injected. Right conjunctiva has no hemorrhage. Left conjunctiva is injected. Left conjunctiva has no hemorrhage. No scleral icterus.  Slit lamp exam:      The right eye shows no corneal abrasion, no corneal ulcer, no foreign body, no hyphema, no fluorescein uptake and no anterior chamber bulge.       The left eye shows corneal abrasion and fluorescein uptake. The left eye shows no corneal ulcer, no foreign body, no hyphema and  no anterior chamber bulge.    IOP 14 in right eye, 16 in left eye.   Neck: Normal range of motion. Neck supple.  Cardiovascular: Normal rate and regular rhythm.   Pulmonary/Chest: Effort normal and breath sounds normal. No respiratory distress.  Musculoskeletal: Normal range of motion. He exhibits no edema or tenderness.  Neurological: He is alert and oriented to person, place, and time.  Skin: Skin is warm and dry. He is not diaphoretic.  Psychiatric: He has a normal mood and affect. His behavior is normal.  Nursing note and vitals reviewed.     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   COORDINATION OF CARE: 4:25 PM- Will instill tetracaine and apply fluorescein strip to check for abrasions of the left eye. Patient verbalizes understanding and agrees to plan.  Medications  fluorescein ophthalmic strip 1 strip (1 strip Both Eyes Given 11/11/14 1633)  tetracaine (PONTOCAINE) 0.5 % ophthalmic solution 1 drop (1 drop Both Eyes Given 11/11/14 1633)    Labs Review Labs Reviewed - No data to display  Imaging Review No results found.     EKG Interpretation None      MDM   Final diagnoses:  Corneal abrasion, left, initial encounter    59 year old male presents with left eye pain, which started this morning. Reports associated tearing and blurry vision. Denies fever, chills, discharge. Denies contact lens wear. Patient is afebrile. Vital signs stable. Injection to left conjunctiva. Area of increased fluorescein uptake to left eye, consistent with corneal abrasion. IOP within normal limits. Will treat with antibiotic eye drops. Patient to follow-up with  ophthalmologist. Return precautions discussed. Patient verbalizes his understanding and is in agreement with plan.  BP 130/87 mmHg  Pulse 71  Temp(Src) 98.4 F (36.9 C) (Oral)  Resp 18  SpO2 99%  I personally performed the services described in this documentation, which was scribed in my presence. The recorded information has been reviewed and is accurate.     Mady Gemma, PA-C 11/12/14 0013  Lyndal Pulley, MD 11/17/14 [phone]

## 2015-04-11 ENCOUNTER — Emergency Department (HOSPITAL_COMMUNITY): Payer: Medicaid Other

## 2015-04-11 ENCOUNTER — Encounter (HOSPITAL_COMMUNITY): Payer: Self-pay

## 2015-04-11 ENCOUNTER — Emergency Department (HOSPITAL_COMMUNITY)
Admission: EM | Admit: 2015-04-11 | Discharge: 2015-04-11 | Disposition: A | Discharge status: Home / Self Care | Payer: Medicaid Other | Attending: Emergency Medicine | Admitting: Emergency Medicine

## 2015-04-11 DIAGNOSIS — J45909 Unspecified asthma, uncomplicated: Secondary | ICD-10-CM | POA: Insufficient documentation

## 2015-04-11 DIAGNOSIS — R42 Dizziness and giddiness: Secondary | ICD-10-CM | POA: Insufficient documentation

## 2015-04-11 DIAGNOSIS — Z8719 Personal history of other diseases of the digestive system: Secondary | ICD-10-CM | POA: Diagnosis not present

## 2015-04-11 DIAGNOSIS — I10 Essential (primary) hypertension: Secondary | ICD-10-CM | POA: Insufficient documentation

## 2015-04-11 DIAGNOSIS — Z7982 Long term (current) use of aspirin: Secondary | ICD-10-CM | POA: Diagnosis not present

## 2015-04-11 DIAGNOSIS — R531 Weakness: Secondary | ICD-10-CM | POA: Diagnosis not present

## 2015-04-11 DIAGNOSIS — I2510 Atherosclerotic heart disease of native coronary artery without angina pectoris: Secondary | ICD-10-CM | POA: Insufficient documentation

## 2015-04-11 DIAGNOSIS — M1990 Unspecified osteoarthritis, unspecified site: Secondary | ICD-10-CM | POA: Diagnosis not present

## 2015-04-11 DIAGNOSIS — R110 Nausea: Secondary | ICD-10-CM | POA: Insufficient documentation

## 2015-04-11 DIAGNOSIS — Z7902 Long term (current) use of antithrombotics/antiplatelets: Secondary | ICD-10-CM | POA: Diagnosis not present

## 2015-04-11 DIAGNOSIS — Z79899 Other long term (current) drug therapy: Secondary | ICD-10-CM | POA: Diagnosis not present

## 2015-04-11 DIAGNOSIS — Z8669 Personal history of other diseases of the nervous system and sense organs: Secondary | ICD-10-CM | POA: Diagnosis not present

## 2015-04-11 DIAGNOSIS — Z791 Long term (current) use of non-steroidal anti-inflammatories (NSAID): Secondary | ICD-10-CM | POA: Diagnosis not present

## 2015-04-11 DIAGNOSIS — R001 Bradycardia, unspecified: Secondary | ICD-10-CM | POA: Diagnosis not present

## 2015-04-11 DIAGNOSIS — F17210 Nicotine dependence, cigarettes, uncomplicated: Secondary | ICD-10-CM | POA: Insufficient documentation

## 2015-04-11 LAB — BASIC METABOLIC PANEL
Anion gap: 11 (ref 5–15)
BUN: 17 mg/dL (ref 6–20)
CO2: 23 mmol/L (ref 22–32)
Calcium: 9.4 mg/dL (ref 8.9–10.3)
Chloride: 107 mmol/L (ref 101–111)
Creatinine, Ser: 1.06 mg/dL (ref 0.61–1.24)
GFR calc Af Amer: >60 mL/min (ref >60)
GFR calc non Af Amer: >60 mL/min (ref >60)
Glucose, Bld: 104 mg/dL — ABNORMAL HIGH (ref 65–99)
Potassium: 4.5 mmol/L (ref 3.5–5.1)
Sodium: 141 mmol/L (ref 135–145)

## 2015-04-11 LAB — CBC
HCT: 42.2 % (ref 39.0–52.0)
Hemoglobin: 13.3 g/dL (ref 13.0–17.0)
MCH: 29.7 pg (ref 26.0–34.0)
MCHC: 31.5 g/dL (ref 30.0–36.0)
MCV: 94.2 fL (ref 78.0–100.0)
Platelets: 220 10*3/uL (ref 150–400)
RBC: 4.48 MIL/uL (ref 4.22–5.81)
RDW: 13.5 % (ref 11.5–15.5)
WBC: 7.2 10*3/uL (ref 4.0–10.5)

## 2015-04-11 LAB — I-STAT TROPONIN, ED: Troponin i, poc: 0 ng/mL (ref 0.00–0.08)

## 2015-04-11 LAB — CBG MONITORING, ED: Glucose-Capillary: 115 mg/dL — ABNORMAL HIGH (ref 65–99)

## 2015-04-11 MED ORDER — MECLIZINE HCL 25 MG PO TABS
25.0000 mg | ORAL_TABLET | Freq: Once | ORAL | Status: AC
  Administered 2015-04-11: 25 mg via ORAL
  Filled 2015-04-11: qty 1

## 2015-04-11 MED ORDER — ONDANSETRON 4 MG PO TBDP: 4.0000 mg | ORAL_TABLET | Freq: Three times a day (TID) | ORAL | Status: DC | PRN

## 2015-04-11 MED ORDER — MECLIZINE HCL 25 MG PO TABS: 25.0000 mg | ORAL_TABLET | Freq: Three times a day (TID) | ORAL | Status: DC | PRN

## 2015-04-11 NOTE — Discharge Instructions (Signed)
Schedule a follow up appointment with your PCP and cardiologist. Return to ED with new, worsening or concerning symptoms.    Benign Positional Vertigo Vertigo is the feeling that you or your surroundings are moving when they are not. Benign positional vertigo is the most common form of vertigo. The cause of this condition is not serious (is benign). This condition is triggered by certain movements and positions (is positional). This condition can be dangerous if it occurs while you are doing something that could endanger you or others, such as driving.  CAUSES In many cases, the cause of this condition is not known. It may be caused by a disturbance in an area of the inner ear that helps your brain to sense movement and balance. This disturbance can be caused by a viral infection (labyrinthitis), head injury, or repetitive motion. RISK FACTORS This condition is more likely to develop in:  Women.  People who are 54 years of age or older. SYMPTOMS Symptoms of this condition usually happen when you move your head or your eyes in different directions. Symptoms may start suddenly, and they usually last for less than a minute. Symptoms may include:  Loss of balance and falling.  Feeling like you are spinning or moving.  Feeling like your surroundings are spinning or moving.  Nausea and vomiting.  Blurred vision.  Dizziness.  Involuntary eye movement (nystagmus). Symptoms can be mild and cause only slight annoyance, or they can be severe and interfere with daily life. Episodes of benign positional vertigo may return (recur) over time, and they may be triggered by certain movements. Symptoms may improve over time. DIAGNOSIS This condition is usually diagnosed by medical history and a physical exam of the head, neck, and ears. You may be referred to a health care provider who specializes in ear, nose, and throat (ENT) problems (otolaryngologist) or a provider who specializes in disorders of the  nervous system (neurologist). You may have additional testing, including:  MRI.  A CT scan.  Eye movement tests. Your health care provider may ask you to change positions quickly while he or she watches you for symptoms of benign positional vertigo, such as nystagmus. Eye movement may be tested with an electronystagmogram (ENG), caloric stimulation, the Dix-Hallpike test, or the roll test.  An electroencephalogram (EEG). This records electrical activity in your brain.  Hearing tests. TREATMENT Usually, your health care provider will treat this by moving your head in specific positions to adjust your inner ear back to normal. Surgery may be needed in severe cases, but this is rare. In some cases, benign positional vertigo may resolve on its own in 2-4 weeks. HOME CARE INSTRUCTIONS Safety  Move slowly.Avoid sudden body or head movements.  Avoid driving.  Avoid operating heavy machinery.  Avoid doing any tasks that would be dangerous to you or others if a vertigo episode would occur.  If you have trouble walking or keeping your balance, try using a cane for stability. If you feel dizzy or unstable, sit down right away.  Return to your normal activities as told by your health care provider. Ask your health care provider what activities are safe for you. General Instructions  Take over-the-counter and prescription medicines only as told by your health care provider.  Avoid certain positions or movements as told by your health care provider.  Drink enough fluid to keep your urine clear or pale yellow.  Keep all follow-up visits as told by your health care provider. This is important. SEEK MEDICAL  CARE IF:  You have a fever.  Your condition gets worse or you develop new symptoms.  Your family or friends notice any behavioral changes.  Your nausea or vomiting gets worse.  You have numbness or a "pins and needles" sensation. SEEK IMMEDIATE MEDICAL CARE IF:  You have  difficulty speaking or moving.  You are always dizzy.  You faint.  You develop severe headaches.  You have weakness in your legs or arms.  You have changes in your hearing or vision.  You develop a stiff neck.  You develop sensitivity to light.   This information is not intended to replace advice given to you by your health care provider. Make sure you discuss any questions you have with your health care provider.   Document Released: 09/30/2005 Document Revised: 09/13/2014 Document Reviewed: 04/17/2014 Elsevier Interactive Patient Education [employer]2016 [employer] Inc[employer]

## 2015-04-11 NOTE — ED Provider Notes (Signed)
CSN: [id number]     Arrival date & time 04/11/15  [id number] History   First MD Initiated Contact with Patient 04/11/15 0831     Chief Complaint  Patient presents with  . Dizziness  . Weakness   HPI  [name] is a 60 year old male with PMHx of CAD, HTN, 1st degree AV block, vertigo, PUD and retinal detachment presenting with dizziness and weakness. Pt reports waking at 6 AM with vision changes. He states that the pictures on the walls were shaking and moving around him. He attempted to "sleep it off" and woke without the visual disturbance. He got up for the day and reports feeling generalized weakness and uncoordinated. He denies unilateral weakness or numbness. He states "I didn't feel dizzy, I felt normal but when I was walking I would feel myself going down and have to hold on to something". This sensation is only present when walking. He reports associated nausea, gagging and diaphoresis. He reports history of similar episodes but is unsure if this is the same sensation. He did not take any medications PTA. He denies any symptoms at this time; states "honestly I feel really normal". Denies fevers, chills, headache, blurred vision, double vision, loss of vision, flashing lights, URI symptoms, chest pain, shortness of breath, abdominal pain, diarrhea, or dysuria. He reports compliance with his medications including plavix. Denies recent falls. Endorses daily smoker, 2-3 gin drinks a day and marijuana.    Chart review: patient seen in ED in 2015 for same complaints. Had brain MR which was negative. Diagnosed with vertigo. Patient has had two cardiac catheterizations in past without stenting. CAD managed by Rx.   Past Medical History  Diagnosis Date  . Hypertension   . Asthma   . Coronary artery disease   . Irregular heart beat   . Stomach ulcer   . Retinal detachment     right eye July 2014  . Arthritis   . Bursitis   . Gout   . Dysrhythmia    Past Surgical History  Procedure Laterality Date   . Cardiovascular stress test    . Appendectomy    . Eye surgery     No family history on file. Social History  Substance Use Topics  . Smoking status: Current Every Day Smoker -- 0.50 packs/day    Types: Cigarettes  . Smokeless tobacco: None  . Alcohol Use: 1.2 oz/week    1 Cans of beer, 1 Shots of liquor per week     Comment: daily     Review of Systems  All other systems reviewed and are negative.     Allergies  Codeine  Home Medications   Prior to Admission medications   Medication Sig Start Date End Date Taking? Authorizing Provider  aspirin 81 MG EC tablet Take 81 mg by mouth daily. 03/07/15  Yes Historical Provider, MD  atorvastatin (LIPITOR) 80 MG tablet Take 80 mg by mouth daily.   Yes Historical Provider, MD  clopidogrel (PLAVIX) 75 MG tablet Take 75 mg by mouth daily.   Yes Historical Provider, MD  ibuprofen (ADVIL,MOTRIN) 800 MG tablet Take 1 tablet (800 mg total) by mouth 3 (three) times daily. 11/11/14  Yes Mady GemmaElizabeth C Westfall, PA-C  lisinopril (PRINIVIL,ZESTRIL) 10 MG tablet Take 10 mg by mouth daily.   Yes Historical Provider, MD  Multiple Vitamin (MULTIVITAMIN WITH MINERALS) TABS tablet Take 1 tablet by mouth daily.   Yes Historical Provider, MD  pantoprazole (PROTONIX) 40 MG tablet Take 40 mg  by mouth daily.   Yes Historical Provider, MD  atorvastatin (LIPITOR) 40 MG tablet Take 1 tablet (40 mg total) by mouth daily at 6 PM. Patient not taking: Reported on 04/11/2015 12/25/13   Orpah Cobb, MD  cephALEXin (KEFLEX) 500 MG capsule Take 1 capsule (500 mg total) by mouth 3 (three) times daily. Patient not taking: Reported on 04/11/2015 03/09/14   Arthor Captain, PA-C  diazepam (VALIUM) 5 MG tablet Take 1 tablet (5 mg total) by mouth 2 (two) times daily. Patient not taking: Reported on 04/11/2015 11/28/13   Antony Madura, PA-C  diclofenac sodium (VOLTAREN) 1 % GEL Apply 2 g topically 4 (four) times daily. Apply to shoulder as directed Patient not taking: Reported on  04/11/2015 11/28/13   Antony Madura, PA-C  erythromycin ophthalmic ointment Place 1 application into the left eye at bedtime. Place 1/2 inch ribbon of ointment in the affected eye at night Patient not taking: Reported on 04/11/2015 11/11/14   Mady Gemma, PA-C  HYDROcodone-acetaminophen (NORCO) 5-325 MG per tablet Take 1 tablet by mouth every 4 (four) hours as needed. Patient not taking: Reported on 04/11/2015 03/09/14   Arthor Captain, PA-C  meclizine (ANTIVERT) 25 MG tablet Take 1 tablet (25 mg total) by mouth 4 (four) times daily. Patient not taking: Reported on 04/11/2015 07/29/13   Margarita Grizzle, MD  meclizine (ANTIVERT) 25 MG tablet Take 1 tablet (25 mg total) by mouth 3 (three) times daily as needed for dizziness. 04/11/15   Janene Yousuf, PA-C  metoprolol tartrate (LOPRESSOR) 25 MG tablet Take 1 tablet (25 mg total) by mouth 2 (two) times daily. Patient not taking: Reported on 04/11/2015 12/25/13   Orpah Cobb, MD  naproxen (NAPROSYN) 375 MG tablet Take 1 tablet (375 mg total) by mouth 2 (two) times daily. Patient not taking: Reported on 04/11/2015 05/26/14   Rodolph Bong, MD  nicotine (NICODERM CQ - DOSED IN MG/24 HOURS) 14 mg/24hr patch Place 1 patch (14 mg total) onto the skin daily. Patient not taking: Reported on 04/11/2015 12/25/13   Orpah Cobb, MD  nitroGLYCERIN (NITROSTAT) 0.4 MG SL tablet Place 1 tablet (0.4 mg total) under the tongue every 5 (five) minutes x 3 doses as needed for chest pain. 12/25/13   Orpah Cobb, MD  ondansetron (ZOFRAN ODT) 4 MG disintegrating tablet Take 1 tablet (4 mg total) by mouth every 8 (eight) hours as needed for nausea or vomiting. 04/11/15   Elantra Caprara, PA-C  oxyCODONE-acetaminophen (PERCOCET) 5-325 MG per tablet Take 1-2 tablets by mouth every 4 (four) hours as needed. Patient not taking: Reported on 04/11/2015 03/09/14   Arthor Captain, PA-C  trimethoprim-polymyxin b (POLYTRIM) ophthalmic solution Place 1 drop into the left eye every 4 (four) hours. Patient not  taking: Reported on 04/11/2015 11/11/14   Mady Gemma, PA-C   BP 159/79 mmHg  Pulse 57  Temp(Src) 97.6 F (36.4 C) (Oral)  Resp 18  Ht  (1.727 m)  Wt 74.844 kg  BMI 25.09 kg/m2  SpO2 98% Physical Exam  Constitutional: He appears well-developed and well-nourished. No distress.  Nontoxic appearing  HENT:  Head: Normocephalic and atraumatic.  Mouth/Throat: Oropharynx is clear and moist. No oropharyngeal exudate.  Eyes: Conjunctivae and EOM are normal. Pupils are equal, round, and reactive to light. Right eye exhibits no discharge. Left eye exhibits no discharge. No scleral icterus.  Neck: Normal range of motion.  Cardiovascular: Regular rhythm and normal heart sounds.  Bradycardia present.   Slightly bradycardic in low 50s. Pt  on metoprolol. Chart review shows hx of HR in low 50s.   Pulmonary/Chest: Effort normal and breath sounds normal. No respiratory distress.  Abdominal: Soft. He exhibits no distension. There is no tenderness.  Musculoskeletal: Normal range of motion.  Moves all extremities spontaneously. Walks with a steady gait  Neurological: He is alert. He has normal strength. No cranial nerve deficit or sensory deficit. Coordination abnormal. GCS eye subscore is 4. GCS verbal subscore is 5. GCS motor subscore is 6.  Cranial nerves 3-12 tested and intact. Strength 5/5 in all major muscle groups. Sensation to light touch intact throughout. Normal coordination on finger to nose with left hand. Slight overshoot on finger to nose with right hand. Coordinated heel to shin bilaterally.   Skin: Skin is warm and dry.  Psychiatric: He has a normal mood and affect. His behavior is normal.  Nursing note and vitals reviewed.   ED Course  Procedures (including critical care time) Labs Review Labs Reviewed  BASIC METABOLIC PANEL - Abnormal; Notable for the following:    Glucose, Bld 104 (*)    All other components within normal limits  CBG MONITORING, ED - Abnormal; Notable  for the following:    Glucose-Capillary 115 (*)    All other components within normal limits  CBC  URINALYSIS, ROUTINE W REFLEX MICROSCOPIC (NOT AT [facility])  I-STAT TROPOININ, ED    Imaging Review Ct Head Wo Contrast  04/11/2015  CLINICAL DATA:  Pt woke at 6:00 and noticed that his vision wasn't normal. "a picture was moving" on the wall. Pt got up and went to bathroom; pt became diaphoretic, very weak, unable to stand by himself.Pt also having memory loss; Hx of HTN EXAM: CT HEAD WITHOUT CONTRAST TECHNIQUE: Contiguous axial images were obtained from the base of the skull through the vertex without intravenous contrast. COMPARISON:  02/15/2013 FINDINGS: Brain: No evidence of acute infarction, hemorrhage, extra-axial collection, ventriculomegaly, or mass effect. Vascular: No hyperdense vessel or unexpected calcification. Atherosclerotic and physiologic intracranial calcifications. Skull: Negative for fracture or focal lesion. Sinuses/Orbits: Mucoperiosteal thickening in the left maxillary sinus. Other: None. IMPRESSION: 1. Negative for bleed or other acute intracranial process. Electronically Signed   By: Corlis Leak M.D.   On: 04/11/2015 10:47   I have personally reviewed and evaluated these images and lab results as part of my medical decision-making.   EKG Interpretation   Date/Time:  Wednesday April 11 2015 08:31:10 EDT Ventricular Rate:  55 PR Interval:  248 QRS Duration: 100 QT Interval:  450 QTC Calculation: 430 R Axis:   61 Text Interpretation:  Sinus bradycardia with 1st degree A-V block ST \\T \ T  wave abnormality, consider inferior ischemia Abnormal ECG Confirmed by RAY  MD, Duwayne Heck ([id number]) on 04/11/2015 9:04:17 AM      MDM   Final diagnoses:  Vertigo  Nausea   60 year old male presenting with vertiginous symptoms. Chart shows patient had similar presentation in the past and was tightness with vertigo at the time. Reports intermittent episodes of this over the past few years.  Slightly bradycardic in the low 50s. Likely secondary to metoprolol. Chart review shows this is a chronic finding. Hypertensive in triage which improved to 150s over 80s during stay. Nontoxic-appearing. Heart regular rhythm. Lungs clear to auscultation bilaterally. No cranial nerve strength or sensory deficit. Slight overshoot on finger to nose of the right hand. Patient notes history of retinal surgery slightly impaired vision in this eye. Normal coordination and left hand and bilateral lower extremities. Blood work  unremarkable. Troponin 0.00. Nonspecific ST and T-wave abnormalities on EKG. CT head negative. Reviewed patient with Dr. Rosalia Hammers. Patient given meclizine and ambulated with a steady gait. He states that he feels back to his normal self. Given history of vertigo and multiple episodes of similar symptoms, this is likely exacerbation of his vertigo. Patient unhappy with diagnosis; states "there is something wrong with me that you guys just aren't finding ". Discussed the vertigo is an episodic disease process and is likely to occur intermittently. Assured patient that there were no abnormalities on head imaging and blood work today. Encouraged him to use the meclizine and follow-up with his primary care provider if the symptoms do not improve. Return precautions given in discharge paperwork and discussed with pt at bedside. Pt stable for discharge     Alveta Heimlich, PA-C 04/11/15 1153  Margarita Grizzle, MD 04/11/15 1540

## 2015-04-11 NOTE — ED Notes (Signed)
Eulah CitizenStevie, PA at bedside to discuss results and plan for pt. This RN discussed discharge instructions and prescriptions of meclizine and zofran with pt. Pt states he wants to know cause of benign vertigo. Explained possibility of alcohol use, marijuana use, cigarette use, and dehydration as possible contributing factors. Pt states he "does not think these are causes and that this RN is a liar." EpworthStevie, GeorgiaPA and this RN explained diagnosis again to pt. Pt has no other questions/concerns. Ambulatory w/ steady gait to lobby.

## 2015-04-11 NOTE — ED Notes (Signed)
Patient awoke with acute dizziness this am. Reports that he feels very weak with same. States that he awoke diaphoretic with same. Alert and oriented

## 2015-04-11 NOTE — ED Notes (Signed)
Pt ambulatory w/ steady gait in hallway, states he "feels back to his normal self."

## 2015-04-11 NOTE — ED Notes (Signed)
This RN reminded pt of need for urine specimen and states "I haven't had enough to drink today in order to urinate." Pt states he will try to obtain specimen again.

## 2015-04-11 NOTE — ED Notes (Signed)
Patient transported to CT 

## 2015-04-14 ENCOUNTER — Emergency Department (HOSPITAL_COMMUNITY)
Admission: EM | Admit: 2015-04-14 | Discharge: 2015-04-14 | Disposition: A | Discharge status: Home / Self Care | Payer: Medicaid Other | Attending: Emergency Medicine | Admitting: Emergency Medicine

## 2015-04-14 ENCOUNTER — Encounter (HOSPITAL_COMMUNITY): Payer: Self-pay | Admitting: Oncology

## 2015-04-14 DIAGNOSIS — X150XXA Contact with hot stove (kitchen), initial encounter: Secondary | ICD-10-CM | POA: Diagnosis not present

## 2015-04-14 DIAGNOSIS — Y998 Other external cause status: Secondary | ICD-10-CM | POA: Diagnosis not present

## 2015-04-14 DIAGNOSIS — I10 Essential (primary) hypertension: Secondary | ICD-10-CM | POA: Insufficient documentation

## 2015-04-14 DIAGNOSIS — Z79899 Other long term (current) drug therapy: Secondary | ICD-10-CM | POA: Diagnosis not present

## 2015-04-14 DIAGNOSIS — F17210 Nicotine dependence, cigarettes, uncomplicated: Secondary | ICD-10-CM | POA: Diagnosis not present

## 2015-04-14 DIAGNOSIS — Z7982 Long term (current) use of aspirin: Secondary | ICD-10-CM | POA: Insufficient documentation

## 2015-04-14 DIAGNOSIS — Y9389 Activity, other specified: Secondary | ICD-10-CM | POA: Diagnosis not present

## 2015-04-14 DIAGNOSIS — J45909 Unspecified asthma, uncomplicated: Secondary | ICD-10-CM | POA: Insufficient documentation

## 2015-04-14 DIAGNOSIS — I2510 Atherosclerotic heart disease of native coronary artery without angina pectoris: Secondary | ICD-10-CM | POA: Insufficient documentation

## 2015-04-14 DIAGNOSIS — T23201A Burn of second degree of right hand, unspecified site, initial encounter: Secondary | ICD-10-CM

## 2015-04-14 DIAGNOSIS — Z791 Long term (current) use of non-steroidal anti-inflammatories (NSAID): Secondary | ICD-10-CM | POA: Insufficient documentation

## 2015-04-14 DIAGNOSIS — M1990 Unspecified osteoarthritis, unspecified site: Secondary | ICD-10-CM | POA: Insufficient documentation

## 2015-04-14 DIAGNOSIS — Z8719 Personal history of other diseases of the digestive system: Secondary | ICD-10-CM | POA: Diagnosis not present

## 2015-04-14 DIAGNOSIS — T23251A Burn of second degree of right palm, initial encounter: Secondary | ICD-10-CM | POA: Insufficient documentation

## 2015-04-14 DIAGNOSIS — Y9289 Other specified places as the place of occurrence of the external cause: Secondary | ICD-10-CM | POA: Diagnosis not present

## 2015-04-14 DIAGNOSIS — T23231A Burn of second degree of multiple right fingers (nail), not including thumb, initial encounter: Secondary | ICD-10-CM | POA: Insufficient documentation

## 2015-04-14 DIAGNOSIS — M109 Gout, unspecified: Secondary | ICD-10-CM | POA: Diagnosis not present

## 2015-04-14 DIAGNOSIS — Z8669 Personal history of other diseases of the nervous system and sense organs: Secondary | ICD-10-CM | POA: Insufficient documentation

## 2015-04-14 DIAGNOSIS — T23001A Burn of unspecified degree of right hand, unspecified site, initial encounter: Secondary | ICD-10-CM | POA: Diagnosis present

## 2015-04-14 DIAGNOSIS — Z7902 Long term (current) use of antithrombotics/antiplatelets: Secondary | ICD-10-CM | POA: Diagnosis not present

## 2015-04-14 MED ORDER — SILVER SULFADIAZINE 1 % EX CREA: 1.0000 "application " | TOPICAL_CREAM | Freq: Every day | CUTANEOUS | Status: DC

## 2015-04-14 MED ORDER — OXYCODONE HCL 5 MG PO TABS: 5.0000 mg | ORAL_TABLET | Freq: Four times a day (QID) | ORAL | Status: DC | PRN

## 2015-04-14 MED ORDER — SILVER SULFADIAZINE 1 % EX CREA
TOPICAL_CREAM | Freq: Once | CUTANEOUS | Status: AC
  Administered 2015-04-14: 1 via TOPICAL
  Filled 2015-04-14: qty 85
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
## 2015-04-14 NOTE — ED Notes (Signed)
Patient is alert and oriented x3.  He was given DC instructions and follow up visit instructions.  Patient gave verbal understanding.  He was DC ambulatory under his own power to home.  V/S stable.  He was not showing any signs of distress on DC 

## 2015-04-14 NOTE — Discharge Instructions (Signed)
1. Medications: oxycodone, take 1000mg  of Tylenol with your oxycodone but do not exceed 4 doses in 24 hours, usual home medications 2. Treatment: rest, drink plenty of fluids, keep wound clean and dry, apply silvadene as directed 3. Follow Up: Please followup with your primary doctor in 2 days for discussion of your diagnoses and further evaluation after today's visit; if you do not have a primary care doctor use the resource guide provided to find one; Please return to the ER for worsening pain, signs of infection    Burn Care Your skin is a natural barrier to infection. It is the largest organ of your body. Burns damage this natural protection. To help prevent infection, it is very important to follow your caregiver's instructions in the care of your burn. Burns are classified as:  First degree. There is only redness of the skin (erythema). No scarring is expected.  Second degree. There is blistering of the skin. Scarring may occur with deeper burns.  Third degree. All layers of the skin are injured, and scarring is expected. HOME CARE INSTRUCTIONS   Wash your hands well before changing your bandage.  Change your bandage as often as directed by your caregiver.  Remove the old bandage. If the bandage sticks, you may soak it off with cool, clean water.  Cleanse the burn thoroughly but gently with mild soap and water.  Pat the area dry with a clean, dry cloth.  Apply a thin layer of antibacterial cream to the burn.  Apply a clean bandage as instructed by your caregiver.  Keep the bandage as clean and dry as possible.  Elevate the affected area for the first 24 hours, then as instructed by your caregiver.  Only take over-the-counter or prescription medicines for pain, discomfort, or fever as directed by your caregiver. SEEK IMMEDIATE MEDICAL CARE IF:   You develop excessive pain.  You develop redness, tenderness, swelling, or red streaks near the burn.  The burned area  develops yellowish-white fluid (pus) or a bad smell.  You have a fever. MAKE SURE YOU:   Understand these instructions.  Will watch your condition.  Will get help right away if you are not doing well or get worse.   This information is not intended to replace advice given to you by your health care provider. Make sure you discuss any questions you have with your health care provider.   Document Released: 12/23/2004 Document Revised: 03/17/2011 Document Reviewed: 05/15/2010 Elsevier Interactive Patient Education [employer]2016 Elsevier Inc.
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
## 2015-06-12 IMAGING — CR DG CHEST 2V
2 series · 2 of 2 positions shown · non-contrast
Comparison: Chest radiograph 04/15/2012

CLINICAL DATA: Left pectus pain, shortness of breath, dizziness.

EXAM:
CHEST  2 VIEW

[w chest pa]
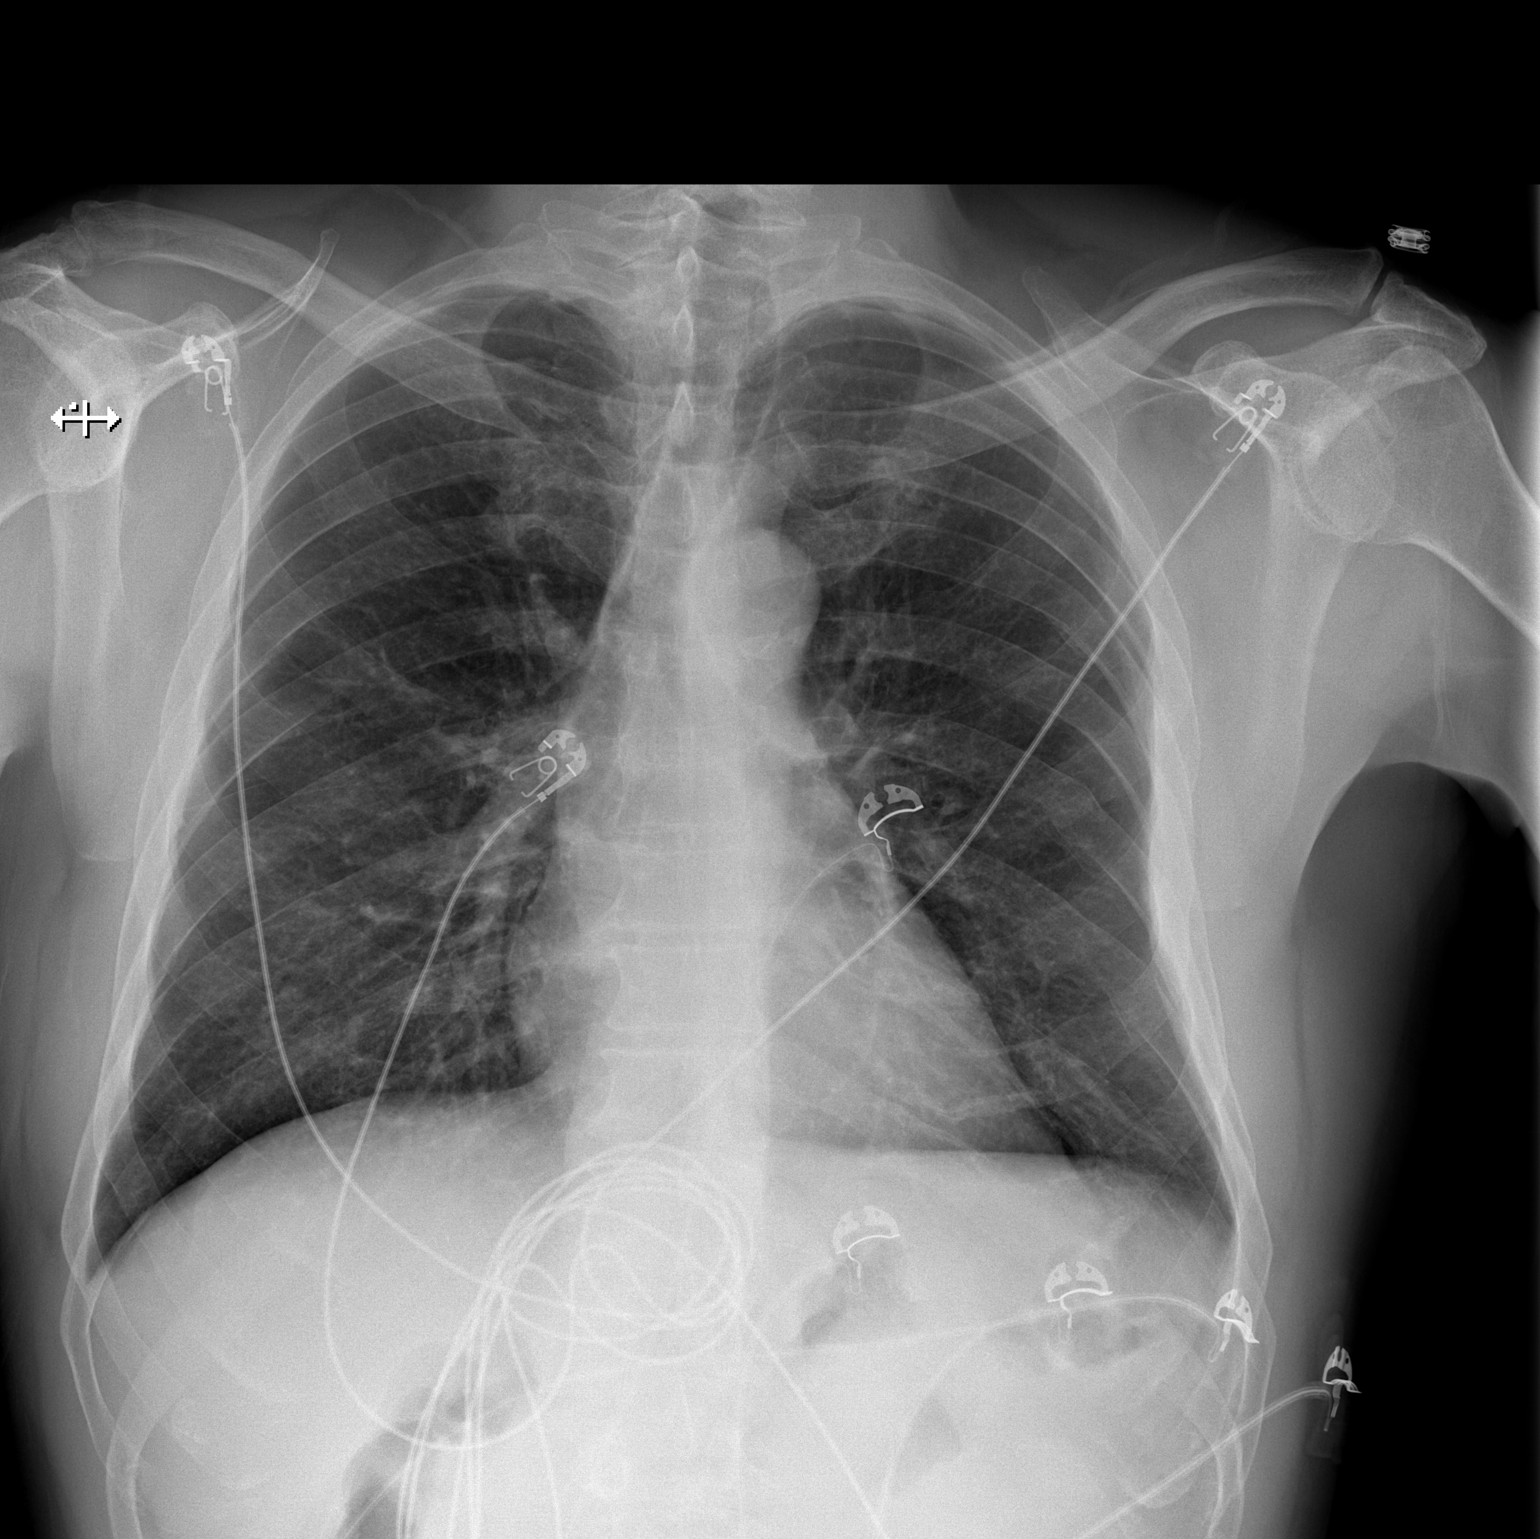

[w chest lat]
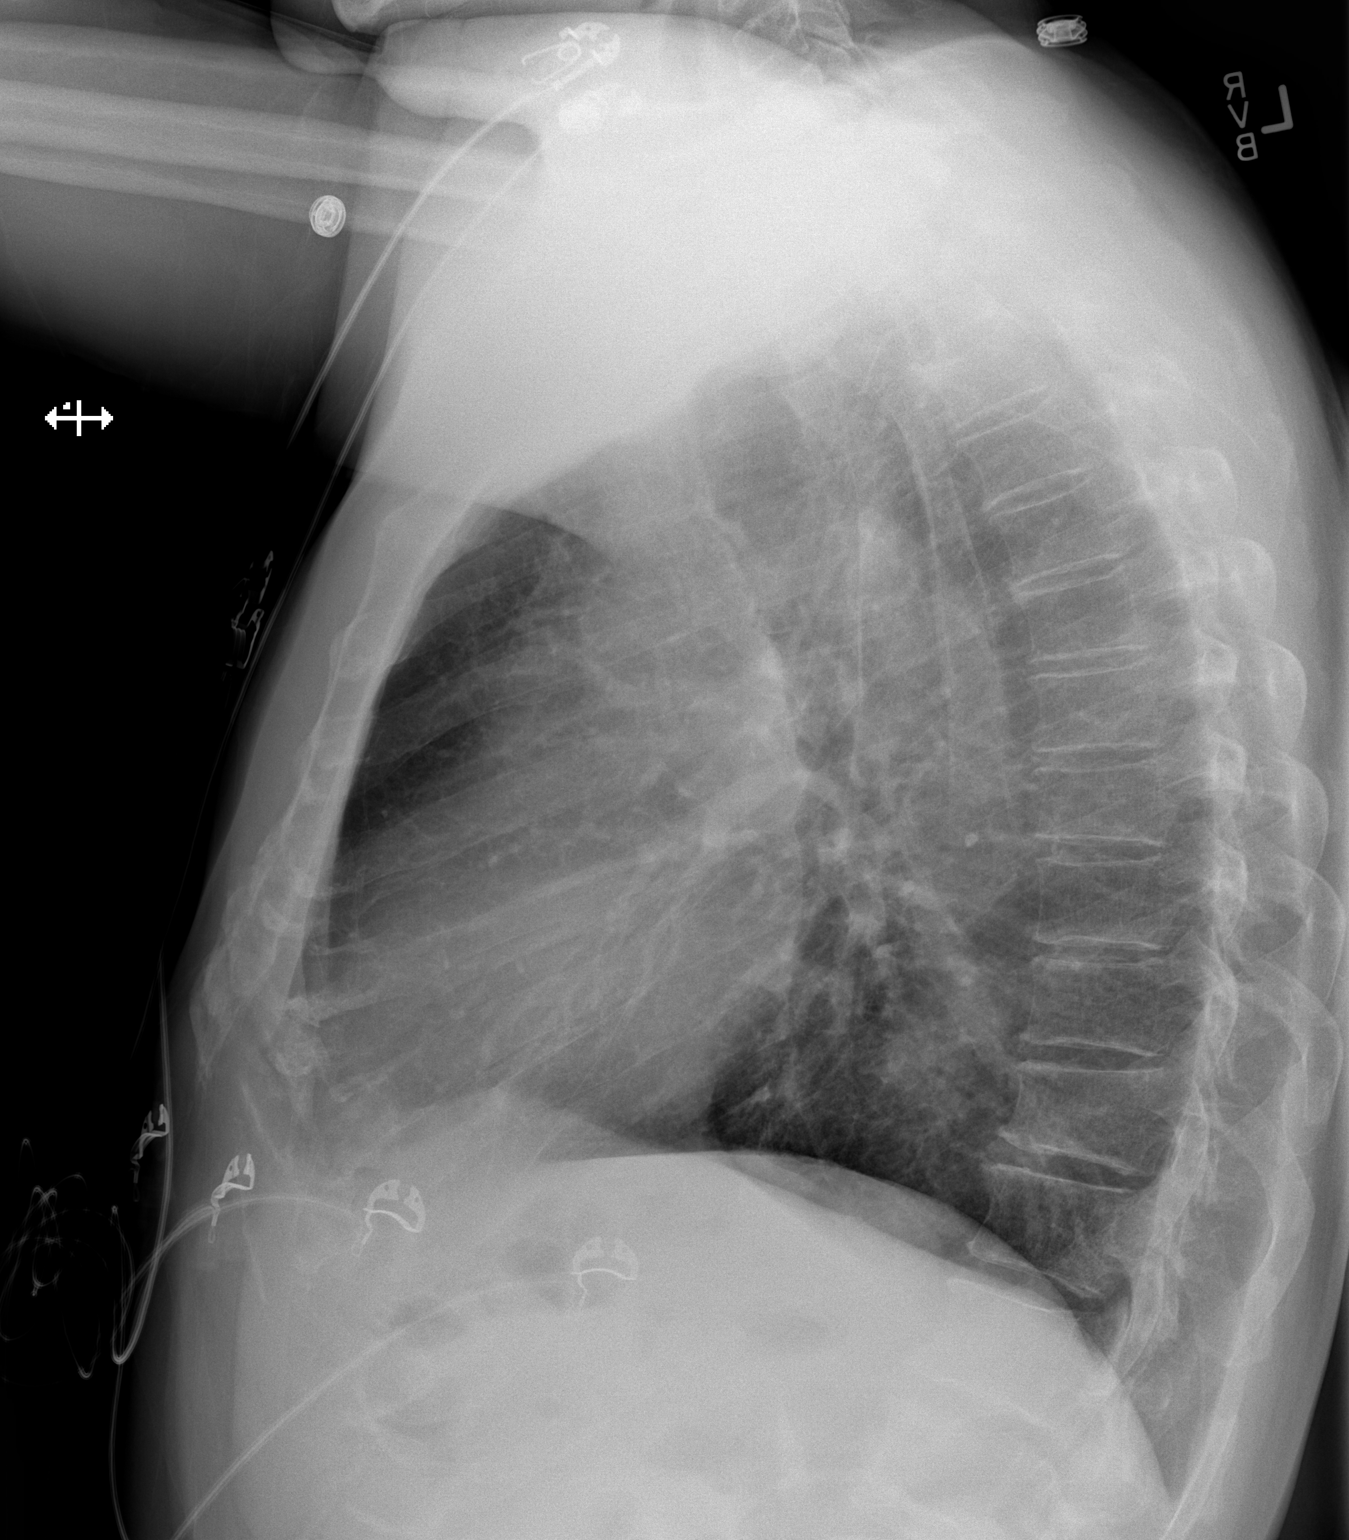

[2 of 2 positions shown; findings below may reference images not displayed]

FINDINGS: Multiple monitoring leads overlie the patient. Stable cardiac and
mediastinal contours. No consolidative pulmonary opacities. No
pleural effusion or pneumothorax. Mid thoracic spine degenerative
changes. Old posterior left rib fractures.
IMPRESSION: No acute cardiopulmonary process.

## 2016-01-09 ENCOUNTER — Other Ambulatory Visit (HOSPITAL_BASED_OUTPATIENT_CLINIC_OR_DEPARTMENT_OTHER): Payer: Self-pay

## 2016-01-09 DIAGNOSIS — G4730 Sleep apnea, unspecified: Secondary | ICD-10-CM

## 2016-02-04 ENCOUNTER — Other Ambulatory Visit: Payer: Self-pay | Admitting: Cardiology

## 2016-02-04 DIAGNOSIS — R079 Chest pain, unspecified: Secondary | ICD-10-CM

## 2016-02-08 ENCOUNTER — Ambulatory Visit (HOSPITAL_COMMUNITY)
Admission: RE | Admit: 2016-02-08 | Discharge: 2016-02-08 | Disposition: A | Discharge status: Home / Self Care | Payer: Medicaid Other | Source: Ambulatory Visit | Attending: Cardiology | Admitting: Cardiology

## 2016-02-08 DIAGNOSIS — R079 Chest pain, unspecified: Secondary | ICD-10-CM | POA: Diagnosis present

## 2016-02-08 MED ORDER — REGADENOSON 0.4 MG/5ML IV SOLN
0.4000 mg | Freq: Once | INTRAVENOUS | Status: AC
  Administered 2016-02-08: 0.4 mg via INTRAVENOUS

## 2016-02-08 MED ORDER — TECHNETIUM TC 99M TETROFOSMIN IV KIT
10.0000 | PACK | Freq: Once | INTRAVENOUS | Status: AC | PRN
  Administered 2016-02-08: 10 via INTRAVENOUS

## 2016-02-08 MED ORDER — TECHNETIUM TC 99M TETROFOSMIN IV KIT
30.0000 | PACK | Freq: Once | INTRAVENOUS | Status: AC | PRN
  Administered 2016-02-08: 30 via INTRAVENOUS

## 2016-02-08 MED ORDER — REGADENOSON 0.4 MG/5ML IV SOLN
INTRAVENOUS | Status: AC
  Administered 2016-02-08: 0.4 mg via INTRAVENOUS
  Filled 2016-02-08: qty 5

## 2016-02-15 ENCOUNTER — Ambulatory Visit (HOSPITAL_BASED_OUTPATIENT_CLINIC_OR_DEPARTMENT_OTHER): Payer: Medicaid Other | Attending: Internal Medicine | Admitting: Internal Medicine

## 2016-02-15 VITALS — Ht 67.5 in | Wt 160.0 lb

## 2016-02-15 DIAGNOSIS — G4730 Sleep apnea, unspecified: Secondary | ICD-10-CM | POA: Diagnosis not present

## 2016-02-15 DIAGNOSIS — R0683 Snoring: Secondary | ICD-10-CM | POA: Diagnosis not present

## 2016-03-01 DIAGNOSIS — G4730 Sleep apnea, unspecified: Secondary | ICD-10-CM | POA: Diagnosis not present

## 2016-03-01 NOTE — Procedures (Signed)
  Patient Name: Danny Conner, Danny Conner Gender: Male D.O.B: 11/23/1955 Age (years): [age] Referring Provider: Fleet ContrasEdwin Avbuere Height (inches): 58 Interpreting Physician: Jetty Duhamellinton Young MD, ABSM Weight (lbs): 160 RPSGT: Rolene ArbourMcConnico, Yvonne BMI: 34 MRN: [id number] Neck Size: 15.50 CLINICAL INFORMATION Sleep Study Type: NPSG     Indication for sleep study: OSA     Epworth Sleepiness Score: 7     SLEEP STUDY TECHNIQUE As per the AASM Manual for the Scoring of Sleep and Associated Events v2.3 (April 2016) with a hypopnea requiring 4% desaturations.  The channels recorded and monitored were frontal, central and occipital EEG, electrooculogram (EOG), submentalis EMG (chin), nasal and oral airflow, thoracic and abdominal wall motion, anterior tibialis EMG, snore microphone, electrocardiogram, and pulse oximetry.  MEDICATIONS Medications self-administered by patient taken the night of the study : none reported  SLEEP ARCHITECTURE The study was initiated at 9:48:05 PM and ended at 4:31:11 AM.  Sleep onset time was 2.4 minutes and the sleep efficiency was 93.0%. The total sleep time was 374.7 minutes.  Stage REM latency was 84.0 minutes.  The patient spent 2.40% of the night in stage N1 sleep, 79.18% in stage N2 sleep, 0.00% in stage N3 and 18.41% in REM.  Alpha intrusion was absent.  Supine sleep was 44.88%.  RESPIRATORY PARAMETERS The overall apnea/hypopnea index (AHI) was 1.1 per hour. There were 0 total apneas, including 0 obstructive, 0 central and 0 mixed apneas. There were 7 hypopneas and 9 RERAs.  The AHI during Stage REM sleep was 0.0 per hour.  AHI while supine was 2.5 per hour.  The mean oxygen saturation was 93.58%. The minimum SpO2 during sleep was 86.00%.  Moderate snoring was noted during this study.  CARDIAC DATA The 2 lead EKG demonstrated sinus rhythm. The mean heart rate was 63.33 beats per minute. Other EKG findings include:  frequent  PVCs.  LEG MOVEMENT DATA The total PLMS were 59 with a resulting PLMS index of 9.45. Associated arousal with leg movement index was 4.0 .  IMPRESSIONS - No significant obstructive sleep apnea occurred during this study (AHI = 1.1/h). - No significant central sleep apnea occurred during this study (CAI = 0.0/h). - Mild oxygen desaturation was noted during this study (Min O2 = 86.00%). - The patient snored with Moderate snoring volume. - EKG findings include frequent PVCs. - Mild periodic limb movements of sleep occurred during the study. No significant associated arousals.  DIAGNOSIS - Primary Snoring (786.09 [R06.83 ICD-10]) - Normal study  RECOMMENDATIONS - Avoid alcohol, sedatives and other CNS depressants that may worsen sleep apnea and disrupt normal sleep architecture. - Sleep hygiene should be reviewed to assess factors that may improve sleep quality. - Weight management and regular exercise should be initiated or continued if appropriate.  [Electronically signed] 03/01/2016 09:50 AM  Jetty Duhamellinton Young MD, ABSM Diplomate, American Board of Sleep Medicine   NPI: [id number]  Waymon BudgeYOUNG,CLINTON D Diplomate, American Board of Sleep Medicine  ELECTRONICALLY SIGNED ON:  03/01/2016, 9:46 AM [facility] PH: [phone]   FX: [phone] ACCREDITED BY THE AMERICAN ACADEMY OF SLEEP MEDICINE

## 2016-09-10 ENCOUNTER — Encounter (HOSPITAL_COMMUNITY): Payer: Self-pay | Admitting: Family Medicine

## 2016-09-10 DIAGNOSIS — Z79899 Other long term (current) drug therapy: Secondary | ICD-10-CM | POA: Insufficient documentation

## 2016-09-10 DIAGNOSIS — Y939 Activity, unspecified: Secondary | ICD-10-CM | POA: Insufficient documentation

## 2016-09-10 DIAGNOSIS — Z7982 Long term (current) use of aspirin: Secondary | ICD-10-CM | POA: Insufficient documentation

## 2016-09-10 DIAGNOSIS — I10 Essential (primary) hypertension: Secondary | ICD-10-CM | POA: Insufficient documentation

## 2016-09-10 DIAGNOSIS — F17210 Nicotine dependence, cigarettes, uncomplicated: Secondary | ICD-10-CM | POA: Insufficient documentation

## 2016-09-10 DIAGNOSIS — Y929 Unspecified place or not applicable: Secondary | ICD-10-CM | POA: Diagnosis not present

## 2016-09-10 DIAGNOSIS — Y999 Unspecified external cause status: Secondary | ICD-10-CM | POA: Diagnosis not present

## 2016-09-10 DIAGNOSIS — S39012A Strain of muscle, fascia and tendon of lower back, initial encounter: Secondary | ICD-10-CM | POA: Diagnosis not present
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
## 2016-09-11 ENCOUNTER — Emergency Department (HOSPITAL_COMMUNITY)
Admission: EM | Admit: 2016-09-11 | Discharge: 2016-09-11 | Disposition: A | Discharge status: Home / Self Care | Payer: No Typology Code available for payment source | Attending: Emergency Medicine | Admitting: Emergency Medicine

## 2016-09-11 ENCOUNTER — Emergency Department (HOSPITAL_COMMUNITY): Payer: No Typology Code available for payment source

## 2016-09-11 DIAGNOSIS — S39012A Strain of muscle, fascia and tendon of lower back, initial encounter: Secondary | ICD-10-CM

## 2016-09-11 MED ORDER — IBUPROFEN 800 MG PO TABS: 800.0000 mg | ORAL_TABLET | Freq: Three times a day (TID) | ORAL | 0 refills | Status: DC | PRN

## 2016-09-11 MED ORDER — HYDROCODONE-ACETAMINOPHEN 5-325 MG PO TABS: 1.0000 | ORAL_TABLET | Freq: Four times a day (QID) | ORAL | 0 refills | Status: DC | PRN
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
## 2016-09-11 NOTE — ED Notes (Signed)
Bed: [facility] Expected date:  Expected time:  Means of arrival:  Comments: 

## 2016-09-11 NOTE — Discharge Instructions (Signed)
Return here as needed. Follow up with a primary doctor. °

## 2016-11-21 ENCOUNTER — Other Ambulatory Visit: Payer: Self-pay | Admitting: Cardiology

## 2016-11-21 DIAGNOSIS — R079 Chest pain, unspecified: Secondary | ICD-10-CM

## 2016-12-08 ENCOUNTER — Encounter (HOSPITAL_COMMUNITY): Payer: Self-pay

## 2016-12-08 ENCOUNTER — Ambulatory Visit (HOSPITAL_COMMUNITY)
Admission: RE | Admit: 2016-12-08 | Discharge: 2016-12-08 | Disposition: A | Discharge status: Home / Self Care | Payer: Medicaid Other | Source: Ambulatory Visit | Attending: Cardiology | Admitting: Cardiology

## 2016-12-15 ENCOUNTER — Ambulatory Visit (HOSPITAL_COMMUNITY): Payer: Medicaid Other

## 2016-12-19 ENCOUNTER — Ambulatory Visit (HOSPITAL_COMMUNITY)
Admission: RE | Admit: 2016-12-19 | Discharge: 2016-12-19 | Disposition: A | Discharge status: Home / Self Care | Payer: Medicaid Other | Source: Ambulatory Visit | Attending: Cardiology | Admitting: Cardiology

## 2016-12-19 DIAGNOSIS — R079 Chest pain, unspecified: Secondary | ICD-10-CM | POA: Diagnosis present

## 2016-12-19 MED ORDER — TECHNETIUM TC 99M TETROFOSMIN IV KIT
30.0000 | PACK | Freq: Once | INTRAVENOUS | Status: AC | PRN
  Administered 2016-12-19: 30 via INTRAVENOUS

## 2016-12-19 MED ORDER — REGADENOSON 0.4 MG/5ML IV SOLN
INTRAVENOUS | Status: AC
  Administered 2016-12-19: 0.4 mg
  Filled 2016-12-19: qty 5

## 2016-12-19 MED ORDER — REGADENOSON 0.4 MG/5ML IV SOLN: 0.4000 mg | Freq: Once | INTRAVENOUS | Status: DC

## 2016-12-19 MED ORDER — TECHNETIUM TC 99M TETROFOSMIN IV KIT
10.0000 | PACK | Freq: Once | INTRAVENOUS | Status: AC | PRN
  Administered 2016-12-19: 10 via INTRAVENOUS

## 2017-01-04 ENCOUNTER — Encounter (HOSPITAL_COMMUNITY): Payer: Self-pay | Admitting: Emergency Medicine

## 2017-01-04 ENCOUNTER — Emergency Department (HOSPITAL_COMMUNITY): Payer: Medicaid Other

## 2017-01-04 ENCOUNTER — Emergency Department (HOSPITAL_COMMUNITY)
Admission: EM | Admit: 2017-01-04 | Discharge: 2017-01-05 | Disposition: A | Discharge status: Home / Self Care | Payer: Medicaid Other | Attending: Emergency Medicine | Admitting: Emergency Medicine

## 2017-01-04 DIAGNOSIS — I10 Essential (primary) hypertension: Secondary | ICD-10-CM | POA: Diagnosis not present

## 2017-01-04 DIAGNOSIS — F17210 Nicotine dependence, cigarettes, uncomplicated: Secondary | ICD-10-CM | POA: Insufficient documentation

## 2017-01-04 DIAGNOSIS — H6122 Impacted cerumen, left ear: Secondary | ICD-10-CM | POA: Diagnosis not present

## 2017-01-04 DIAGNOSIS — J45901 Unspecified asthma with (acute) exacerbation: Secondary | ICD-10-CM | POA: Diagnosis not present

## 2017-01-04 DIAGNOSIS — J069 Acute upper respiratory infection, unspecified: Secondary | ICD-10-CM | POA: Diagnosis not present

## 2017-01-04 DIAGNOSIS — R05 Cough: Secondary | ICD-10-CM | POA: Diagnosis present

## 2017-01-04 DIAGNOSIS — Z79899 Other long term (current) drug therapy: Secondary | ICD-10-CM | POA: Insufficient documentation

## 2017-01-04 DIAGNOSIS — R0981 Nasal congestion: Secondary | ICD-10-CM | POA: Insufficient documentation

## 2017-01-04 DIAGNOSIS — B9789 Other viral agents as the cause of diseases classified elsewhere: Secondary | ICD-10-CM | POA: Diagnosis not present

## 2017-01-04 LAB — URINALYSIS, ROUTINE W REFLEX MICROSCOPIC
Bilirubin Urine: NEGATIVE
Glucose, UA: NEGATIVE mg/dL
Hgb urine dipstick: NEGATIVE
Ketones, ur: 5 mg/dL — AB
Leukocytes, UA: NEGATIVE
Nitrite: NEGATIVE
Protein, ur: NEGATIVE mg/dL
Specific Gravity, Urine: 1.021 (ref 1.005–1.030)
pH: 5 (ref 5.0–8.0)

## 2017-01-04 LAB — CBC
HCT: 37.3 % — ABNORMAL LOW (ref 39.0–52.0)
Hemoglobin: 12.2 g/dL — ABNORMAL LOW (ref 13.0–17.0)
MCH: 29.7 pg (ref 26.0–34.0)
MCHC: 32.7 g/dL (ref 30.0–36.0)
MCV: 90.8 fL (ref 78.0–100.0)
Platelets: 230 10*3/uL (ref 150–400)
RBC: 4.11 MIL/uL — ABNORMAL LOW (ref 4.22–5.81)
RDW: 14.9 % (ref 11.5–15.5)
WBC: 7.8 10*3/uL (ref 4.0–10.5)

## 2017-01-04 LAB — I-STAT TROPONIN, ED: Troponin i, poc: 0 ng/mL (ref 0.00–0.08)

## 2017-01-04 LAB — BASIC METABOLIC PANEL
Anion gap: 7 (ref 5–15)
BUN: 14 mg/dL (ref 6–20)
CO2: 22 mmol/L (ref 22–32)
Calcium: 8.9 mg/dL (ref 8.9–10.3)
Chloride: 105 mmol/L (ref 101–111)
Creatinine, Ser: 1.06 mg/dL (ref 0.61–1.24)
GFR calc Af Amer: >60 mL/min (ref >60)
GFR calc non Af Amer: >60 mL/min (ref >60)
Glucose, Bld: 119 mg/dL — ABNORMAL HIGH (ref 65–99)
Potassium: 3.8 mmol/L (ref 3.5–5.1)
Sodium: 134 mmol/L — ABNORMAL LOW (ref 135–145)

## 2017-01-04 MED ORDER — METHYLPREDNISOLONE SODIUM SUCC 125 MG IJ SOLR
125.0000 mg | Freq: Once | INTRAMUSCULAR | Status: AC
  Administered 2017-01-04: 125 mg via INTRAVENOUS
  Filled 2017-01-04: qty 2

## 2017-01-04 MED ORDER — IPRATROPIUM-ALBUTEROL 0.5-2.5 (3) MG/3ML IN SOLN
3.0000 mL | Freq: Once | RESPIRATORY_TRACT | Status: AC
  Administered 2017-01-04: 3 mL via RESPIRATORY_TRACT
  Filled 2017-01-04: qty 3

## 2017-01-04 MED ORDER — SODIUM CHLORIDE 0.9 % IV BOLUS (SEPSIS)
500.0000 mL | Freq: Once | INTRAVENOUS | Status: AC
  Administered 2017-01-04: 500 mL via INTRAVENOUS

## 2017-01-04 NOTE — ED Triage Notes (Addendum)
Patient c/o congestion, chills and fatigue for past 2 weeks. Pt states PCP gave him antibiotics last week and given z-pack. Pt reports feeling better briefly after antibiotics but symptoms came right back. Denies any pain just "feel very tired and don't want to eat." pt adds right sided flank pain onset of 2 days ago and darker urine output.

## 2017-01-04 NOTE — ED Provider Notes (Signed)
[facility]-EMERGENCY DEPT Provider Note   CSN: [id number] Arrival date & time: 01/04/17  1602     History   Chief Complaint Chief Complaint  Patient presents with  . Flu Like Symptoms  . Flank Pain    HPI Danny Conner is a 61 y.o. male with PMH/o Retinal Detachment, CAD, Dysrhythmia who presents for evaluation for 2 weeks of cough, nasal congestion, rhinorrhea, generalized fatigue, decreased appetite.  Patient reports this is been ongoing for the last 2 weeks.  He was seen by his cardiologist for an unrelated issue.  At that time, he was diagnosed with a upper respiratory infection and prescribed a course of azithromycin, which he states he took.  Patient states that initially he had some improvement after antibiotics but that symptoms returned.  Patient reports that he has had subjective fever and chills, generalized weakness and fatigue, decreased appetite.  Patient also reports he has had continued coughing.  He states that the cough will sometimes produce phlegm but other times it feels like he cannot get the sputum up.  Patient reports that he does have a history of asthma and this was to be using albuterol inhaler but states that he has run out.  He has been using albuterol nebulizer but states he has had minimal improvement with nebulizer use.  Patient also reports that he has had some right flank/right lower back pain.  Denies any preceding trauma, injury.  He does have chronic right hip pain from previous incident.  Patient is a current smoker and smokes approximately 1 pack of cigarettes a day. He denies any hormone use, recent immobilization, prior history of DVT/PE, recent surgery, leg swelling, or long travel.  Patient denies any chest pain, leg swelling, numbness/weakness of his arms or legs, back pain.  The history is provided by the patient.    Past Medical History:  Diagnosis Date  . Arthritis   . Asthma   . Bursitis   . Coronary artery disease   .  Dysrhythmia   . Gout   . Hypertension   . Irregular heart beat   . Retinal detachment    right eye July 2014  . Stomach ulcer     Patient Active Problem List   Diagnosis Date Noted  . Chest pain at rest 12/24/2013  . Altered mental status 10/22/2012    Past Surgical History:  Procedure Laterality Date  . APPENDECTOMY    . CARDIOVASCULAR STRESS TEST    . EYE SURGERY         Home Medications    Prior to Admission medications   Medication Sig Start Date End Date Taking? Authorizing Provider  albuterol (PROVENTIL HFA;VENTOLIN HFA) 108 (90 Base) MCG/ACT inhaler Inhale 1-2 puffs into the lungs every 6 (six) hours as needed for wheezing or shortness of breath. 01/05/17   Maxwell Caul, PA-C  aspirin 81 MG EC tablet Take 81 mg by mouth daily. 03/07/15   [provider]  atorvastatin (LIPITOR) 40 MG tablet Take 1 tablet (40 mg total) by mouth daily at 6 PM. 12/25/13   Orpah Cobb, MD  atorvastatin (LIPITOR) 80 MG tablet Take 80 mg by mouth daily.    [provider]  clopidogrel (PLAVIX) 75 MG tablet Take 75 mg by mouth daily.    [provider]  HYDROcodone-acetaminophen (NORCO/VICODIN) 5-325 MG tablet Take 1 tablet by mouth every 6 (six) hours as needed for moderate pain. Patient not taking: Reported on 12/19/2016 09/11/16   Charlestine Night,  PA-C  ibuprofen (ADVIL,MOTRIN) 800 MG tablet Take 1 tablet (800 mg total) by mouth every 8 (eight) hours as needed. Patient not taking: Reported on 12/19/2016 09/11/16   Charlestine NightLawyer, Christopher, PA-C  lisinopril (PRINIVIL,ZESTRIL) 10 MG tablet Take 10 mg by mouth daily.    [provider]  meclizine (ANTIVERT) 25 MG tablet Take 1 tablet (25 mg total) by mouth 3 (three) times daily as needed for dizziness. 04/11/15   Barrett, Rolm GalaStevi, PA-C  metoprolol tartrate (LOPRESSOR) 25 MG tablet Take 1 tablet (25 mg total) by mouth 2 (two) times daily. Patient not taking: Reported on 04/11/2015 12/25/13   Orpah CobbKadakia, Ajay, MD    Multiple Vitamin (MULTIVITAMIN WITH MINERALS) TABS tablet Take 1 tablet by mouth daily.    [provider]  nitroGLYCERIN (NITROSTAT) 0.4 MG SL tablet Place 1 tablet (0.4 mg total) under the tongue every 5 (five) minutes x 3 doses as needed for chest pain. 12/25/13   Orpah CobbKadakia, Ajay, MD  ondansetron (ZOFRAN ODT) 4 MG disintegrating tablet Take 1 tablet (4 mg total) by mouth every 8 (eight) hours as needed for nausea or vomiting. Patient not taking: Reported on 12/19/2016 04/11/15   Barrett, Rolm GalaStevi, PA-C  oxyCODONE (ROXICODONE) 5 MG immediate release tablet Take 1 tablet (5 mg total) by mouth every 6 (six) hours as needed for severe pain. Patient not taking: Reported on 12/19/2016 04/14/15   Muthersbaugh, Dahlia ClientHannah, PA-C  pantoprazole (PROTONIX) 40 MG tablet Take 40 mg by mouth daily.    [provider]  predniSONE (DELTASONE) 20 MG tablet Take 2 tablets (40 mg total) by mouth daily for 4 days. 01/05/17 01/09/17  Maxwell CaulLayden, Lindsey A, PA-C  silver sulfADIAZINE (SILVADENE) 1 % cream Apply 1 application topically daily. Patient not taking: Reported on 12/19/2016 04/14/15   Muthersbaugh, Dahlia ClientHannah, PA-C    Family History No family history on file.  Social History Social History   Tobacco Use  . Smoking status: Current Every Day Smoker    Packs/day: 0.10    Types: Cigarettes  . Smokeless tobacco: Never Used  Substance Use Topics  . Alcohol use: Yes    Alcohol/week: 1.2 oz    Types: 1 Cans of beer, 1 Shots of liquor per week    Comment: Last drink: Sunday  . Drug use: No    Comment: Previous Marijuana use     Allergies   Codeine   Review of Systems Review of Systems  Constitutional: Positive for chills. Negative for fever (Subjective).  HENT: Positive for congestion and rhinorrhea.   Respiratory: Positive for cough and shortness of breath.   Cardiovascular: Negative for chest pain.  Gastrointestinal: Negative for abdominal pain, nausea and vomiting.  Genitourinary: Positive for  flank pain. Negative for dysuria and hematuria.  Musculoskeletal: Negative for back pain and neck pain.  Neurological: Negative for dizziness, weakness (Generalized) and headaches.     Physical Exam Updated Vital Signs BP 118/86   Pulse 76   Temp 98.8 F (37.1 C) (Oral)   Resp 18   SpO2 100%   Physical Exam  Constitutional: He is oriented to person, place, and time. He appears well-developed and well-nourished.  NAD  HENT:  Head: Normocephalic and atraumatic.  Mouth/Throat: Oropharynx is clear and moist and mucous membranes are normal.  Eyes: Conjunctivae, EOM and lids are normal. Pupils are equal, round, and reactive to light.  Neck: Full passive range of motion without pain.  Cardiovascular: Normal rate, regular rhythm, normal heart sounds and normal pulses. Exam reveals no gallop and  no friction rub.  No murmur heard. Pulses:      Radial pulses are 2+ on the right side, and 2+ on the left side.       Dorsalis pedis pulses are 2+ on the right side, and 2+ on the left side.  Pulmonary/Chest: Effort normal. No respiratory distress. He has wheezes in the right upper field and the left upper field.  No evidence of respiratory distress. Able to speak in full sentences without difficulty.  Abdominal: Soft. Normal appearance. There is no tenderness. There is no rigidity and no guarding.  Musculoskeletal: Normal range of motion.  BLE are symmetric in appearance.   Neurological: He is alert and oriented to person, place, and time.  Cranial nerves III-XII intact Follows commands, Moves all extremities  5/5 strength to BUE and BLE  Sensation intact throughout all major nerve distributions Normal finger to nose. No dysdiadochokinesia. No pronator drift. No gait abnormalities  No slurred speech. No facial droop.   Skin: Skin is warm and dry. Capillary refill takes less than 2 seconds.  Psychiatric: He has a normal mood and affect. His speech is normal.  Nursing note and vitals  reviewed.    ED Treatments / Results  Labs (all labs ordered are listed, but only abnormal results are displayed) Labs Reviewed  URINALYSIS, ROUTINE W REFLEX MICROSCOPIC - Abnormal; Notable for the following components:      Result Value   Ketones, ur 5 (*)    All other components within normal limits  BASIC METABOLIC PANEL - Abnormal; Notable for the following components:   Sodium 134 (*)    Glucose, Bld 119 (*)    All other components within normal limits  CBC - Abnormal; Notable for the following components:   RBC 4.11 (*)    Hemoglobin 12.2 (*)    HCT 37.3 (*)    All other components within normal limits  I-STAT TROPONIN, ED    EKG  EKG Interpretation  Date/Time:  Sunday January 04 2017 22:50:11 EST Ventricular Rate:  69 PR Interval:    QRS Duration: 107 QT Interval:  429 QTC Calculation: 460 R Axis:   5 Text Interpretation:  Sinus rhythm Ventricular trigeminy Baseline wander in lead(s) V5 Trigeminy is new in comparison to prior Confirmed by Alvira Monday ([id number]) on 01/04/2017 11:09:58 PM       Radiology Dg Chest 2 View  Result Date: 01/04/2017 CLINICAL DATA:  61 y/o  M; cough and congestion. EXAM: CHEST  2 VIEW COMPARISON:  09/11/2016 chest radiograph FINDINGS: Normal cardiac silhouette. Aortic atherosclerosis with calcification. Clear lungs. No pleural effusion or pneumothorax. Chronic left-sided rib fractures. No acute fracture identified. IMPRESSION: No active cardiopulmonary disease. Electronically Signed   By: Mitzi Hansen M.D.   On: 01/04/2017 22:45    Procedures Procedures (including critical care time)  Medications Ordered in ED Medications  albuterol (PROVENTIL HFA;VENTOLIN HFA) 108 (90 Base) MCG/ACT inhaler 1 puff (not administered)  ipratropium-albuterol (DUONEB) 0.5-2.5 (3) MG/3ML nebulizer solution 3 mL (3 mLs Nebulization Given 01/04/17 2248)  methylPREDNISolone sodium succinate (SOLU-MEDROL) 125 mg/2 mL injection 125 mg (125 mg  Intravenous Given 01/04/17 2241)  sodium chloride 0.9 % bolus 500 mL (0 mLs Intravenous Stopped 01/05/17 0019)  ipratropium-albuterol (DUONEB) 0.5-2.5 (3) MG/3ML nebulizer solution 3 mL (3 mLs Nebulization Given 01/05/17 0019)     Initial Impression / Assessment and Plan / ED Course  I have reviewed the triage vital signs and the nursing notes.  Pertinent labs & imaging results that  were available during my care of the patient were reviewed by me and considered in my medical decision making (see chart for details).     61 y.o. M with PMH/o CAD, Dysrhythmia who presents for evaluation of cough, nasal congestion, rhinorrhea, generalized weakness, fatigue. Seen by cardiology and given Azithromycin, which slightly improved symptoms. Then symptoms returned, along with decreased appetite, fatigue, generalized weakness. Subjective fever and chills. Has a history of asthma. Has been using albuterol nebulizer. Ran out of his albuterol inhaler. Patient is afebrile, non-toxic appearing, sitting comfortably on examination table. Vital signs reviewed and stable. No evidence of respiratory distress.  Consider acute infectious etiology versus asthma exacerbation, particularly given wheezing on exam.  History/physical exam and on concerning for aortic dissection or PE.  Low suspicion for ACS etiology, given that patient is not complaining of any chest pain.  Initial labs ordered in triage.  Plan for additional troponin, EKG and chest x-ray.  Given wheezing noted on exam, will do DuoNeb. Plan for steroids in the department.    BMP shows sodium 134 glucose 119 otherwise unremarkable.  CBC shows slight anemia of 12.2 and 37.3.  Previous records to the patient previously had a hemoglobin of 13.3.  Patient denies any rectal bleeding.  Do not believe that that is causing his symptoms.  UA is unremarkable.  Troponin is negative.  EKG shows some ventricular trigeminy but otherwise unremarkable.  Chest x-ray negative for any  acute infectious etiology.  Reevaluation after initial DuoNeb.  Patient with significant improvement in wheezing.  O2 sats remained stable.  Patient still has some residual wheezing.  Will give additional DuoNeb.  Patient also complaining of some left ear pain.  He has cerumen impaction noted left ear.  We will plan for earwax removal.  Reevaluation after second nebulizer treatment.  Patient with significant improvement wheezing.  He reports of dramatic relief.  O2 sats remained greater than 95% on room air.  Reevaluation of left ear shows no signs of infection.  TM intact.  Patient able to ambulate in the department without difficulty.  O2 sats remained greater than 95%.  Plan to give albuterol inhaler here in the department.  Plan to send home patient with albuterol inhaler and prescription for prednisone.  Instructed patient to follow-up with his cardiologist regarding EKG findings. Patient had ample opportunity for questions and discussion. All patient's questions were answered with full understanding. Strict return precautions discussed. Patient expresses understanding and agreement to plan.    Final Clinical Impressions(s) / ED Diagnoses   Final diagnoses:  Exacerbation of asthma, unspecified asthma severity, unspecified whether persistent  Viral upper respiratory infection    ED Discharge Orders        Ordered    predniSONE (DELTASONE) 20 MG tablet  Daily     01/05/17 0157    albuterol (PROVENTIL HFA;VENTOLIN HFA) 108 (90 Base) MCG/ACT inhaler  Every 6 hours PRN     01/05/17 0157       Maxwell CaulLayden, Lindsey A, PA-C 01/05/17 1617    Alvira MondaySchlossman, Erin, MD 01/13/17 [phone]

## 2017-01-04 NOTE — ED Notes (Signed)
Will do EKG when nurse finishes collecting labs

## 2017-01-05 MED ORDER — IPRATROPIUM-ALBUTEROL 0.5-2.5 (3) MG/3ML IN SOLN
3.0000 mL | Freq: Once | RESPIRATORY_TRACT | Status: AC
  Administered 2017-01-05: 3 mL via RESPIRATORY_TRACT
  Filled 2017-01-05: qty 3

## 2017-01-05 MED ORDER — ALBUTEROL SULFATE HFA 108 (90 BASE) MCG/ACT IN AERS
1.0000 | INHALATION_SPRAY | Freq: Once | RESPIRATORY_TRACT | Status: AC
  Administered 2017-01-05: 1 via RESPIRATORY_TRACT
  Filled 2017-01-05: qty 6.7

## 2017-01-05 MED ORDER — ALBUTEROL SULFATE HFA 108 (90 BASE) MCG/ACT IN AERS: 1.0000 | INHALATION_SPRAY | Freq: Four times a day (QID) | RESPIRATORY_TRACT | 0 refills | Status: DC | PRN

## 2017-01-05 MED ORDER — PREDNISONE 20 MG PO TABS: 40.0000 mg | ORAL_TABLET | Freq: Every day | ORAL | 0 refills | Status: AC

## 2017-01-05 NOTE — Discharge Instructions (Addendum)
We believe that your symptoms are caused today by an exacerbation of your asthma.  ° °Please take the prescribed medications and any medications that you have at home.   ° °Follow up with your doctor as recommended. If you do not have a primary care doctor, you can use one listed in the paperwork.  ° °If you develop any new or worsening symptoms, including but not limited to fever, persistent vomiting, worsening shortness of breath, or other symptoms that concern you, please return to the Emergency Department immediately. ° °

## 2017-01-05 NOTE — ED Notes (Signed)
Pt ambulated well in the hall without assistance--- pt's O2 sat remained 100% on room air while ambulating; no s/s increased respiratory efforts noted while ambulating.

## 2019-09-23 ENCOUNTER — Other Ambulatory Visit: Payer: Self-pay | Admitting: Sleep Medicine

## 2019-09-23 ENCOUNTER — Other Ambulatory Visit: Payer: Self-pay

## 2019-09-23 DIAGNOSIS — Z20822 Contact with and (suspected) exposure to covid-19: Secondary | ICD-10-CM

## 2019-09-26 ENCOUNTER — Other Ambulatory Visit (HOSPITAL_COMMUNITY): Payer: Medicaid Other

## 2019-09-26 ENCOUNTER — Telehealth: Payer: Self-pay

## 2019-09-26 LAB — NOVEL CORONAVIRUS, NAA: SARS-CoV-2, NAA: NOT DETECTED

## 2019-09-26 NOTE — Telephone Encounter (Signed)
Called and informed patient that test for Covid 19 was NEGATIVE. Discussed signs and symptoms of Covid 19 : fever, chills, respiratory symptoms, cough, ENT symptoms, sore throat, SOB, muscle pain, diarrhea, headache, loss of taste/smell, close exposure to COVID-19 patient. Pt instructed to call PCP if they develop the above signs and sx. Pt also instructed to call 911 if having respiratory issues/distress.  Pt verbalized understanding.Spoke with pt's daughter.

## 2019-09-27 ENCOUNTER — Encounter (HOSPITAL_COMMUNITY): Payer: Self-pay | Admitting: Oral Surgery

## 2019-09-27 ENCOUNTER — Other Ambulatory Visit: Payer: Self-pay

## 2019-09-27 NOTE — Progress Notes (Signed)
Mr. Cona denies chest pain or shortness of breath at this time. Mr. Murri reports that he has chest pain at times and has had for years, last time was 2. 5 - 3 weeks ago.Patient reports that the pain hits,  "I can be lying down or doing something, it feels like a [place] kicked me."  Mr Zorn states that the sharp pain last 10 seconds or so, but it is sore to touch for a while, " this may go on and off for a day."  Patient states that sometimes it is his chest and back and under left arm. "Patient stated that pain is not bad enough to open a bottle of NTG.  Mr. Poffenberger reports that Dr. Sharyn Lull is aware, and has done stress test and EKG, no Cardiac Cath. Mr. Kron reports that he saw Dr. Sharyn Lull within the last month. I requested records from Dr. Annitta Jersey office.   Mr. Homan was tested negative for Covid on Friday after his mother in law, that lives with him was diagnosised with Covid.  I spoke with Sherian Rein, RN, MSN, unit AD, who said that surgery if elective, needs to be postponed.  I called Dr. Randa Evens office and informed Sam, Doreatha Martin is going to notify Mr. Pullin.

## 2019-09-28 ENCOUNTER — Ambulatory Visit (HOSPITAL_COMMUNITY): Admission: RE | Admit: 2019-09-28 | Payer: Medicaid Other | Source: Home / Self Care | Admitting: Oral Surgery

## 2019-09-28 HISTORY — DX: Acute myocardial infarction, unspecified: I21.9

## 2019-09-28 HISTORY — DX: Gastro-esophageal reflux disease without esophagitis: K21.9

## 2019-09-28 SURGERY — DENTAL RESTORATION/EXTRACTIONS
Anesthesia: General

## 2019-10-27 NOTE — H&P (Signed)
HISTORY AND PHYSICAL  Danny Conner is a 64 y.o. male patient with CC: painful teeth  No diagnosis found.  Past Medical History:  Diagnosis Date  . Arthritis   . Asthma   . Bursitis   . Coronary artery disease   . Dysrhythmia   . GERD (gastroesophageal reflux disease)   . Gout   . Hypertension   . Irregular heart beat   . Myocardial infarction (HCC)   . Retinal detachment    right eye July 2014  . Stomach ulcer     No current facility-administered medications for this encounter.   Current Outpatient Medications  Medication Sig Dispense Refill  . amLODipine (NORVASC) 10 MG tablet Take 10 mg by mouth daily.    Marland Kitchen aspirin 81 MG EC tablet Take 81 mg by mouth daily.  3  . atorvastatin (LIPITOR) 40 MG tablet Take 1 tablet (40 mg total) by mouth daily at 6 PM. (Patient taking differently: Take 80 mg by mouth daily at 6 PM. ) 30 tablet 3  . clopidogrel (PLAVIX) 75 MG tablet Take 75 mg by mouth daily.    Marland Kitchen lisinopril (ZESTRIL) 20 MG tablet Take 20 mg by mouth daily.     . metoprolol tartrate (LOPRESSOR) 25 MG tablet Take 1 tablet (25 mg total) by mouth 2 (two) times daily. (Patient taking differently: Take 25 mg by mouth daily. ) 60 tablet 3  . Multiple Vitamin (MULTIVITAMIN WITH MINERALS) TABS tablet Take 1 tablet by mouth daily.    . nitroGLYCERIN (NITROSTAT) 0.4 MG SL tablet Place 1 tablet (0.4 mg total) under the tongue every 5 (five) minutes x 3 doses as needed for chest pain. 25 tablet 1  . Omega-3 1000 MG CAPS Take 1,000 mg by mouth daily.    . pantoprazole (PROTONIX) 40 MG tablet Take 40 mg by mouth daily.     Allergies  Allergen Reactions  . Codeine Nausea And Vomiting   Active Problems:   * No active hospital problems. *  Vitals: There were no vitals taken for this visit. Lab results:No results found for this or any previous visit (from the past 24 hour(s)). Radiology Results: No results found. General appearance: alert, cooperative and no distress Head: Normocephalic,  without obvious abnormality, atraumatic Eyes: negative Nose: Nares normal. Septum midline. Mucosa normal. No drainage or sinus tenderness. Throat: Multiple carious teeth. No purulence, edema, trismus. Pharynx clear. Neck: no adenopathy and supple, symmetrical, trachea midline Resp: clear to auscultation bilaterally Cardio: regular rate and rhythm, S1, S2 normal, no murmur, click, rub or gallop  Assessment: Multiple non-restorable teeth.  Plan: Multiple dental extractions with alveoloplasty. GA. Day surgery.   Ocie Doyne 10/27/2019

## 2019-10-28 ENCOUNTER — Other Ambulatory Visit: Payer: Self-pay

## 2019-10-28 ENCOUNTER — Encounter (HOSPITAL_COMMUNITY): Payer: Self-pay | Admitting: Oral Surgery

## 2019-10-28 ENCOUNTER — Inpatient Hospital Stay (HOSPITAL_COMMUNITY)
Admission: RE | Admit: 2019-10-28 | Discharge: 2019-10-28 | Disposition: A | Discharge status: Home / Self Care | Payer: Medicaid Other | Source: Ambulatory Visit

## 2019-10-28 NOTE — Progress Notes (Signed)
   10/28/19 1214  OBSTRUCTIVE SLEEP APNEA  Have you ever been diagnosed with sleep apnea through a sleep study? No  Do you snore loudly (loud enough to be heard through closed doors)?  0  Do you often feel tired, fatigued, or sleepy during the daytime (such as falling asleep during driving or talking to someone)? 1  Has anyone observed you stop breathing during your sleep? 1  Do you have, or are you being treated for high blood pressure? 0  BMI more than 35 kg/m2? 0  Age > 50 (1-yes) 1  Male Gender (Yes=1) 1  Obstructive Sleep Apnea Score 4  Score 5 or greater  Results sent to PCP

## 2019-10-28 NOTE — Progress Notes (Signed)
[name] denies chest pain or shortness of breath at this time. Mr Danny Conner was positive for Covid at [facility] 09/30/19- does not need to be retested.  [name] reports that he has chest pain at times- "it last about 1 sec- it could start at my left arm pit and go to mid chest- it sharps, but it goes away quickly."  Cardiologist is Dr Conley Canal . I requested records.  [name] stated that Dr. Lucita Lora is a ware and told he to stop drinking- patient said he stopped.  Plavix and Aspirin last dose was 10/26/19.

## 2019-10-28 NOTE — Progress Notes (Signed)
Pt not tested for covid today due to pt testing + for covid on 09/30/19 (results in Care Everywhere). Based on the guidelines the pt is in the 90 day window to not retest. The pt is still expected to quarantine until their procedure. The pt's procedure is scheduled for Mon 10/31/19 with Dr. Barbette Merino at [facility] hospital.Therefore, the pt can still have the scheduled procedure.   These are the guidelines as follows:  Guidance: Patient previously tested + COVID; now past 90 day window seeking elective surgery (asymptomatic)  Retest patient If negative, proceed with surgery If positive, postpone surgery for 10 days from positive test Patient to quarantine for the (10 days) Do not retest again prior to surgery (even if scheduled a couple of weeks out) Use standard precautions for surgery

## 2019-10-28 NOTE — Anesthesia Preprocedure Evaluation (Addendum)
Anesthesia Evaluation  Patient identified by MRN, date of birth, ID band Patient awake    Reviewed: Allergy & Precautions, NPO status , Patient's Chart, lab work & pertinent test results  Airway Mallampati: II  TM Distance: >3 FB Neck ROM: Full    Dental no notable dental hx.    Pulmonary asthma , Current Smoker,    Pulmonary exam normal breath sounds clear to auscultation       Cardiovascular Exercise Tolerance: Good hypertension, + CAD and + Past MI  Normal cardiovascular exam+ dysrhythmias  Rhythm:Regular Rate:Normal  07/28/19 and 10/24/19 cardiology notes by Dr. Sharyn Lull reviewed. Known history of atypical chest pains/stable angina. Drinks 1 pint every few days and was encouraged to quit drinking alcohol. Mild-moderate CAD by 2012 [facility]. Last stress test 2018 showed stable inferior defect (possible old MI), stable EF 37%, stable global hypokinesis. EF up to 45-49% in 2019. No new orders at 10/24/19 visit. Patient reported instruction to hold ASA and Plavix starting 10/26/19.       Neuro/Psych Retinal detachment right eye negative psych ROS   GI/Hepatic PUD, GERD  ,(+)     substance abuse  alcohol use,   Endo/Other  negative endocrine ROS  Renal/GU negative Renal ROS     Musculoskeletal  (+) Arthritis , Osteoarthritis,    Abdominal   Peds  Hematology negative hematology ROS (+)   Anesthesia Other Findings   Reproductive/Obstetrics                          Anesthesia Physical Anesthesia Plan  ASA: III  Anesthesia Plan: General   Post-op Pain Management:    Induction:   PONV Risk Score and Plan: 1 and Midazolam, Dexamethasone, Ondansetron and Treatment may vary due to age or medical condition  Airway Management Planned: Nasal ETT  Additional Equipment:   Intra-op Plan:   Post-operative Plan: Extubation in OR  Informed Consent: I have reviewed the patients History and Physical,  chart, labs and discussed the procedure including the risks, benefits and alternatives for the proposed anesthesia with the patient or authorized representative who has indicated his/her understanding and acceptance.       Plan Discussed with: CRNA and Anesthesiologist  Anesthesia Plan Comments: (Right nare more open than left. )      Anesthesia Quick Evaluation
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
## 2019-10-31 ENCOUNTER — Other Ambulatory Visit: Payer: Self-pay

## 2019-10-31 ENCOUNTER — Ambulatory Visit (HOSPITAL_COMMUNITY)
Admission: RE | Admit: 2019-10-31 | Discharge: 2019-10-31 | Disposition: A | Discharge status: Home / Self Care | Payer: Medicaid Other | Attending: Oral Surgery | Admitting: Oral Surgery

## 2019-10-31 ENCOUNTER — Ambulatory Visit (HOSPITAL_COMMUNITY): Payer: Medicaid Other | Admitting: Vascular Surgery

## 2019-10-31 ENCOUNTER — Encounter (HOSPITAL_COMMUNITY): Payer: Self-pay | Admitting: Oral Surgery

## 2019-10-31 ENCOUNTER — Encounter (HOSPITAL_COMMUNITY)
Admission: RE | Disposition: A | Discharge status: Home / Self Care | Payer: Self-pay | Source: Home / Self Care | Attending: Oral Surgery

## 2019-10-31 DIAGNOSIS — I2510 Atherosclerotic heart disease of native coronary artery without angina pectoris: Secondary | ICD-10-CM | POA: Diagnosis not present

## 2019-10-31 DIAGNOSIS — M1990 Unspecified osteoarthritis, unspecified site: Secondary | ICD-10-CM | POA: Diagnosis not present

## 2019-10-31 DIAGNOSIS — Z8711 Personal history of peptic ulcer disease: Secondary | ICD-10-CM | POA: Insufficient documentation

## 2019-10-31 DIAGNOSIS — K029 Dental caries, unspecified: Secondary | ICD-10-CM | POA: Insufficient documentation

## 2019-10-31 DIAGNOSIS — I10 Essential (primary) hypertension: Secondary | ICD-10-CM | POA: Diagnosis not present

## 2019-10-31 DIAGNOSIS — Z79899 Other long term (current) drug therapy: Secondary | ICD-10-CM | POA: Diagnosis not present

## 2019-10-31 DIAGNOSIS — Z791 Long term (current) use of non-steroidal anti-inflammatories (NSAID): Secondary | ICD-10-CM | POA: Insufficient documentation

## 2019-10-31 DIAGNOSIS — M109 Gout, unspecified: Secondary | ICD-10-CM | POA: Insufficient documentation

## 2019-10-31 DIAGNOSIS — Z885 Allergy status to narcotic agent status: Secondary | ICD-10-CM | POA: Insufficient documentation

## 2019-10-31 DIAGNOSIS — I252 Old myocardial infarction: Secondary | ICD-10-CM | POA: Insufficient documentation

## 2019-10-31 DIAGNOSIS — J45909 Unspecified asthma, uncomplicated: Secondary | ICD-10-CM | POA: Insufficient documentation

## 2019-10-31 DIAGNOSIS — Z7982 Long term (current) use of aspirin: Secondary | ICD-10-CM | POA: Diagnosis not present

## 2019-10-31 DIAGNOSIS — K219 Gastro-esophageal reflux disease without esophagitis: Secondary | ICD-10-CM | POA: Insufficient documentation

## 2019-10-31 HISTORY — DX: Pneumonia, unspecified organism: J18.9

## 2019-10-31 HISTORY — PX: TOOTH EXTRACTION: SHX859

## 2019-10-31 LAB — CBC
HCT: 41.7 % (ref 39.0–52.0)
Hemoglobin: 12.9 g/dL — ABNORMAL LOW (ref 13.0–17.0)
MCH: 31 pg (ref 26.0–34.0)
MCHC: 30.9 g/dL (ref 30.0–36.0)
MCV: 100.2 fL — ABNORMAL HIGH (ref 80.0–100.0)
Platelets: 172 10*3/uL (ref 150–400)
RBC: 4.16 MIL/uL — ABNORMAL LOW (ref 4.22–5.81)
RDW: 13 % (ref 11.5–15.5)
WBC: 7.2 10*3/uL (ref 4.0–10.5)
nRBC: 0 % (ref 0.0–0.2)

## 2019-10-31 LAB — COMPREHENSIVE METABOLIC PANEL
ALT: 62 U/L — ABNORMAL HIGH (ref 0–44)
AST: 71 U/L — ABNORMAL HIGH (ref 15–41)
Albumin: 3.7 g/dL (ref 3.5–5.0)
Alkaline Phosphatase: 42 U/L (ref 38–126)
Anion gap: 9 (ref 5–15)
BUN: 15 mg/dL (ref 8–23)
CO2: 21 mmol/L — ABNORMAL LOW (ref 22–32)
Calcium: 9.3 mg/dL (ref 8.9–10.3)
Chloride: 105 mmol/L (ref 98–111)
Creatinine, Ser: 0.69 mg/dL (ref 0.61–1.24)
GFR, Estimated: >60 mL/min (ref >60)
Glucose, Bld: 85 mg/dL (ref 70–99)
Potassium: 4.6 mmol/L (ref 3.5–5.1)
Sodium: 135 mmol/L (ref 135–145)
Total Bilirubin: 0.8 mg/dL (ref 0.3–1.2)
Total Protein: 7.6 g/dL (ref 6.5–8.1)

## 2019-10-31 SURGERY — DENTAL RESTORATION/EXTRACTIONS
Anesthesia: General | Site: Mouth

## 2019-10-31 MED ORDER — LACTATED RINGERS IV SOLN: INTRAVENOUS | Status: DC | PRN

## 2019-10-31 MED ORDER — PROPOFOL 10 MG/ML IV BOLUS
INTRAVENOUS | Status: AC
  Filled 2019-10-31: qty 20

## 2019-10-31 MED ORDER — HYDROCODONE-ACETAMINOPHEN 5-325 MG PO TABS
ORAL_TABLET | ORAL | Status: AC
  Filled 2019-10-31: qty 1

## 2019-10-31 MED ORDER — LACTATED RINGERS IV SOLN: INTRAVENOUS | Status: DC

## 2019-10-31 MED ORDER — CHLORHEXIDINE GLUCONATE 0.12 % MT SOLN: 15.0000 mL | Freq: Once | OROMUCOSAL | Status: AC

## 2019-10-31 MED ORDER — CHLORHEXIDINE GLUCONATE 0.12 % MT SOLN
OROMUCOSAL | Status: AC
  Administered 2019-10-31: 15 mL via OROMUCOSAL
  Filled 2019-10-31: qty 15

## 2019-10-31 MED ORDER — PROMETHAZINE HCL 25 MG/ML IJ SOLN: 6.2500 mg | INTRAMUSCULAR | Status: DC | PRN

## 2019-10-31 MED ORDER — 0.9 % SODIUM CHLORIDE (POUR BTL) OPTIME
TOPICAL | Status: DC | PRN
  Administered 2019-10-31: 1000 mL

## 2019-10-31 MED ORDER — OXYMETAZOLINE HCL 0.05 % NA SOLN
NASAL | Status: AC
  Filled 2019-10-31: qty 30

## 2019-10-31 MED ORDER — EPHEDRINE 5 MG/ML INJ
INTRAVENOUS | Status: AC
  Filled 2019-10-31: qty 10

## 2019-10-31 MED ORDER — HYDROCODONE-ACETAMINOPHEN 5-325 MG PO TABS
1.0000 | ORAL_TABLET | Freq: Once | ORAL | Status: AC
  Administered 2019-10-31: 1 via ORAL

## 2019-10-31 MED ORDER — LIDOCAINE 2% (20 MG/ML) 5 ML SYRINGE
INTRAMUSCULAR | Status: DC | PRN
  Administered 2019-10-31: 100 mg via INTRAVENOUS

## 2019-10-31 MED ORDER — SUGAMMADEX SODIUM 200 MG/2ML IV SOLN
INTRAVENOUS | Status: DC | PRN
  Administered 2019-10-31: 200 mg via INTRAVENOUS

## 2019-10-31 MED ORDER — CEFAZOLIN SODIUM-DEXTROSE 2-4 GM/100ML-% IV SOLN
INTRAVENOUS | Status: AC
  Filled 2019-10-31: qty 100

## 2019-10-31 MED ORDER — SUCCINYLCHOLINE CHLORIDE 200 MG/10ML IV SOSY
PREFILLED_SYRINGE | INTRAVENOUS | Status: AC
  Filled 2019-10-31: qty 10

## 2019-10-31 MED ORDER — LIDOCAINE-EPINEPHRINE 2 %-1:100000 IJ SOLN
INTRAMUSCULAR | Status: DC | PRN
  Administered 2019-10-31: 14 mL via INTRADERMAL

## 2019-10-31 MED ORDER — NICOTINE 21 MG/24HR TD PT24
21.0000 mg | MEDICATED_PATCH | Freq: Every day | TRANSDERMAL | 1 refills | Status: DC
Start: 2019-10-31 — End: 2021-06-23

## 2019-10-31 MED ORDER — OXYMETAZOLINE HCL 0.05 % NA SOLN
NASAL | Status: DC | PRN
  Administered 2019-10-31: 2 via NASAL

## 2019-10-31 MED ORDER — MIDAZOLAM HCL 5 MG/5ML IJ SOLN
INTRAMUSCULAR | Status: DC | PRN
  Administered 2019-10-31: 2 mg via INTRAVENOUS

## 2019-10-31 MED ORDER — ROCURONIUM BROMIDE 10 MG/ML (PF) SYRINGE
PREFILLED_SYRINGE | INTRAVENOUS | Status: DC | PRN
  Administered 2019-10-31: 80 mg via INTRAVENOUS

## 2019-10-31 MED ORDER — ONDANSETRON 4 MG PO TBDP: 4.0000 mg | ORAL_TABLET | Freq: Three times a day (TID) | ORAL | 0 refills | Status: DC | PRN

## 2019-10-31 MED ORDER — ROCURONIUM BROMIDE 10 MG/ML (PF) SYRINGE
PREFILLED_SYRINGE | INTRAVENOUS | Status: AC
  Filled 2019-10-31: qty 10

## 2019-10-31 MED ORDER — ONDANSETRON HCL 4 MG/2ML IJ SOLN
INTRAMUSCULAR | Status: DC | PRN
  Administered 2019-10-31: 4 mg via INTRAVENOUS

## 2019-10-31 MED ORDER — FENTANYL CITRATE (PF) 100 MCG/2ML IJ SOLN
INTRAMUSCULAR | Status: AC
  Filled 2019-10-31: qty 2

## 2019-10-31 MED ORDER — FENTANYL CITRATE (PF) 250 MCG/5ML IJ SOLN
INTRAMUSCULAR | Status: DC | PRN
  Administered 2019-10-31: 100 ug via INTRAVENOUS
  Administered 2019-10-31: 50 ug via INTRAVENOUS

## 2019-10-31 MED ORDER — MIDAZOLAM HCL 2 MG/2ML IJ SOLN
INTRAMUSCULAR | Status: AC
  Filled 2019-10-31: qty 2

## 2019-10-31 MED ORDER — HYDROCODONE-ACETAMINOPHEN 5-325 MG PO TABS: 1.0000 | ORAL_TABLET | Freq: Four times a day (QID) | ORAL | 0 refills | Status: AC | PRN

## 2019-10-31 MED ORDER — CEFAZOLIN SODIUM-DEXTROSE 2-4 GM/100ML-% IV SOLN
2.0000 g | INTRAVENOUS | Status: AC
  Administered 2019-10-31: 2 g via INTRAVENOUS

## 2019-10-31 MED ORDER — LIDOCAINE-EPINEPHRINE 2 %-1:100000 IJ SOLN
INTRAMUSCULAR | Status: AC
  Filled 2019-10-31: qty 1

## 2019-10-31 MED ORDER — ONDANSETRON HCL 4 MG/2ML IJ SOLN
INTRAMUSCULAR | Status: AC
  Filled 2019-10-31: qty 2

## 2019-10-31 MED ORDER — SODIUM CHLORIDE 0.9 % IR SOLN
Status: DC | PRN
  Administered 2019-10-31: 1000 mL

## 2019-10-31 MED ORDER — LIDOCAINE 2% (20 MG/ML) 5 ML SYRINGE
INTRAMUSCULAR | Status: AC
  Filled 2019-10-31: qty 5

## 2019-10-31 MED ORDER — OXYMETAZOLINE HCL 0.05 % NA SOLN
NASAL | Status: DC | PRN
  Administered 2019-10-31: 1

## 2019-10-31 MED ORDER — FENTANYL CITRATE (PF) 250 MCG/5ML IJ SOLN
INTRAMUSCULAR | Status: AC
  Filled 2019-10-31: qty 5

## 2019-10-31 MED ORDER — AMOXICILLIN 500 MG PO CAPS: 500.0000 mg | ORAL_CAPSULE | Freq: Three times a day (TID) | ORAL | 0 refills | Status: DC

## 2019-10-31 MED ORDER — DEXAMETHASONE SODIUM PHOSPHATE 10 MG/ML IJ SOLN
INTRAMUSCULAR | Status: AC
  Filled 2019-10-31: qty 2

## 2019-10-31 MED ORDER — DEXAMETHASONE SODIUM PHOSPHATE 10 MG/ML IJ SOLN
INTRAMUSCULAR | Status: DC | PRN
  Administered 2019-10-31: 5 mg via INTRAVENOUS

## 2019-10-31 MED ORDER — PHENYLEPHRINE 40 MCG/ML (10ML) SYRINGE FOR IV PUSH (FOR BLOOD PRESSURE SUPPORT)
PREFILLED_SYRINGE | INTRAVENOUS | Status: AC
  Filled 2019-10-31: qty 10

## 2019-10-31 MED ORDER — ORAL CARE MOUTH RINSE: 15.0000 mL | Freq: Once | OROMUCOSAL | Status: AC

## 2019-10-31 MED ORDER — PROPOFOL 10 MG/ML IV BOLUS
INTRAVENOUS | Status: DC | PRN
  Administered 2019-10-31: 200 mg via INTRAVENOUS

## 2019-10-31 MED ORDER — FENTANYL CITRATE (PF) 100 MCG/2ML IJ SOLN
25.0000 ug | INTRAMUSCULAR | Status: DC | PRN
  Administered 2019-10-31: 50 ug via INTRAVENOUS

## 2019-10-31 SURGICAL SUPPLY — 39 items
BLADE SURG 15 STRL LF DISP TIS (BLADE) ×1 IMPLANT
BLADE SURG 15 STRL SS (BLADE) ×2
BUR CROSS CUT FISSURE 1.6 (BURR) ×2 IMPLANT
BUR EGG ELITE 4.0 (BURR) ×2 IMPLANT
CANISTER SUCT 3000ML PPV (MISCELLANEOUS) ×2 IMPLANT
COVER SURGICAL LIGHT HANDLE (MISCELLANEOUS) ×2 IMPLANT
COVER WAND RF STERILE (DRAPES) IMPLANT
DECANTER SPIKE VIAL GLASS SM (MISCELLANEOUS) ×2 IMPLANT
DRAPE U-SHAPE 76X120 STRL (DRAPES) ×2 IMPLANT
GAUZE PACKING FOLDED 2  STR (GAUZE/BANDAGES/DRESSINGS) ×2
GAUZE PACKING FOLDED 2 STR (GAUZE/BANDAGES/DRESSINGS) ×1 IMPLANT
GLOVE BIO SURGEON STRL SZ 6.5 (GLOVE) ×1 IMPLANT
GLOVE BIO SURGEON STRL SZ7 (GLOVE) IMPLANT
GLOVE BIO SURGEON STRL SZ8 (GLOVE) ×2 IMPLANT
GLOVE BIOGEL PI IND STRL 6.5 (GLOVE) IMPLANT
GLOVE BIOGEL PI IND STRL 7.0 (GLOVE) IMPLANT
GLOVE BIOGEL PI INDICATOR 6.5 (GLOVE) ×1
GLOVE BIOGEL PI INDICATOR 7.0 (GLOVE)
GOWN STRL REUS W/ TWL LRG LVL3 (GOWN DISPOSABLE) ×1 IMPLANT
GOWN STRL REUS W/ TWL XL LVL3 (GOWN DISPOSABLE) ×1 IMPLANT
GOWN STRL REUS W/TWL LRG LVL3 (GOWN DISPOSABLE) ×2
GOWN STRL REUS W/TWL XL LVL3 (GOWN DISPOSABLE) ×2
IV NS 1000ML (IV SOLUTION) ×2
IV NS 1000ML BAXH (IV SOLUTION) ×1 IMPLANT
KIT BASIN OR (CUSTOM PROCEDURE TRAY) ×2 IMPLANT
KIT TURNOVER KIT B (KITS) ×2 IMPLANT
NDL HYPO 25GX1X1/2 BEV (NEEDLE) ×2 IMPLANT
NEEDLE HYPO 25GX1X1/2 BEV (NEEDLE) ×4 IMPLANT
NS IRRIG 1000ML POUR BTL (IV SOLUTION) ×2 IMPLANT
PAD ARMBOARD 7.5X6 YLW CONV (MISCELLANEOUS) ×2 IMPLANT
SLEEVE IRRIGATION ELITE 7 (MISCELLANEOUS) ×2 IMPLANT
SPONGE SURGIFOAM ABS GEL 12-7 (HEMOSTASIS) IMPLANT
SUCTION FRAZIER HANDLE 10FR (MISCELLANEOUS) ×2
SUCTION TUBE FRAZIER 10FR DISP (MISCELLANEOUS) IMPLANT
SUT CHROMIC 3 0 PS 2 (SUTURE) ×2 IMPLANT
SYR CONTROL 10ML LL (SYRINGE) ×2 IMPLANT
TRAY ENT MC OR (CUSTOM PROCEDURE TRAY) ×2 IMPLANT
TUBING IRRIGATION (MISCELLANEOUS) ×2 IMPLANT
YANKAUER SUCT BULB TIP NO VENT (SUCTIONS) ×2 IMPLANT

## 2019-10-31 NOTE — Op Note (Signed)
10/31/2019  12:15 PM  PATIENT:  Danny Conner  64 y.o. male  PRE-OPERATIVE DIAGNOSIS:  DENTAL CARIES  POST-OPERATIVE DIAGNOSIS:  SAME  PROCEDURE:  Procedure(s): DENTAL /EXTRACTIONS TEETH NUMBERS THREE, FOUR, FIVE, SIX, SEVEN, EIGHT,NINE, TEN, ELEVEN, TWELVE, FOURTEEN, FIFTEEN, TWENTY TWO, ALVEOLPLASTY  SURGEON:  Surgeon(s): Ocie Doyne, DDS  ANESTHESIA:   local and general  EBL:  minimal  DRAINS: none   SPECIMEN:  No Specimen  COUNTS:  YES  PLAN OF CARE: Discharge to home after PACU  PATIENT DISPOSITION:  PACU - hemodynamically stable.   PROCEDURE DETAILS: Dictation #  Georgia Lopes, DMD 10/31/2019 12:15 PM

## 2019-10-31 NOTE — Op Note (Signed)
Danny Conner, Danny Conner MEDICAL RECORD TD:[id number] ACCOUNT [id number] DATE OF BIRTH:October 30, 1955 FACILITY: [facility] LOCATION: [facility] PHYSICIAN:Kasmira Cacioppo M. Gracielynn Birkel, DDS  OPERATIVE REPORT  DATE OF PROCEDURE:  10/31/2019  PREOPERATIVE DIAGNOSIS:  Multiple dental caries, teeth numbers 3, 4, 5, 6, 7, 8, 9, 10, 11, 12, 14, 15, 22.  POSTOPERATIVE DIAGNOSIS:  Multiple dental caries, teeth numbers 3, 4, 5, 6, 7, 8, 9, 10, 11, 12, 14, 15, 22.  PROCEDURES:   1.  Extraction of teeth numbers 3, 4, 5, 6, 7, 8, 9, 10, 11, 12, 14, 15, 22.   2.  Alveoplasty, right and left maxilla.  SURGEON:  Ocie Doyne, DDS  ANESTHESIA:  General nasal intubation, Dr. Donavan Foil attending.  DESCRIPTION OF PROCEDURE:  The patient was taken to the operating room and placed on the table in supine position.  General anesthesia was administered intravenously and nasal endotracheal tube was placed and secured.  The eyes were protected and the  patient was draped for surgery.  A timeout was performed.  The posterior pharynx was suctioned and a throat pack was placed.  Two percent lidocaine with 1:100,000 epinephrine was infiltrated in an inferior alveolar block on the left side and in buccal  and palatal infiltration in the maxilla around the teeth to be removed.  A bite block was placed on the right side of the mouth and a sweetheart retractor was used to retract the tongue.  A #15 blade was used to make an incision around tooth #22, which  had severe buccal class 5 decay.  The periosteum was reflected.  The tooth was elevated and removed with the dental forceps.  The socket was curetted, irrigated, and closed with 3-0 chromic.  In the maxilla, the #15 blade was used to make an incision  beginning at tooth #15.  The incision was carried in the buccal and palatal sulcus across the midline to tooth #6.  The periosteum was reflected.  The teeth were elevated.  Teeth numbers 15, 14 and 12 were removed with the dental forceps.  Tooth #11  fractured  upon attempted removal.  Teeth numbers 10 9, 8 and 7 were removed with the dental forceps.  Then, the Stryker handpiece was used with the fissure bur under irrigation to remove circumferential bone around the tooth #11 root.  Then, the root was  elevated and removed with dental forceps.  The sockets were curetted.  The tissue was trimmed to allow for primary closure and then the egg bur under irrigation was used and the Stryker handpiece to perform alveoplasty to bony irregularities and sharp  socket fragments.  Then, the area was further smoothed with a bone file and then closed with 3-0 chromic.  The bite block was placed on the other side of the mouth and the 15 blade used to make an incision around teeth numbers 3, 4 and 6.  The periosteum  was reflected.  Teeth 3 and 4 were elevated and removed with the dental forceps.  Tooth #6 fractured upon attempted removal, necessitating removal of circumferential bone using a Stryker handpiece with the fissure bur.  The root was removed and then the  areas were irrigated, curetted and alveoplasty was performed using the egg bur, followed by the bone file.  Then, the area was irrigated and closed with 3-0 chromic.  The oral cavity was irrigated and suctioned.  The throat pack was removed.  The  patient was left in care of anesthesia for transport to recovery room with plans for discharge home through  day surgery.  ESTIMATED BLOOD LOSS:  Minimal.  COMPLICATIONS:  None.  SPECIMENS:  None.  VN/NUANCE  D:10/31/2019 T:10/31/2019 JOB:[id number]

## 2019-10-31 NOTE — Anesthesia Procedure Notes (Signed)
Procedure Name: Intubation Date/Time: 10/31/2019 11:44 AM Performed by: Shirlyn Goltz, CRNA Pre-anesthesia Checklist: Patient identified, Emergency Drugs available, Suction available and Patient being monitored Patient Re-evaluated:Patient Re-evaluated prior to induction Oxygen Delivery Method: Circle system utilized Preoxygenation: Pre-oxygenation with 100% oxygen Induction Type: IV induction Ventilation: Mask ventilation without difficulty Laryngoscope Size: Mac and 4 Grade View: Grade I Nasal Tubes: Nasal prep performed, Nasal Rae and Right Tube size: 7.5 mm Number of attempts: 1 Placement Confirmation: ETT inserted through vocal cords under direct vision,  positive ETCO2 and breath sounds checked- equal and bilateral Secured at: 27 cm Tube secured with: Tape Dental Injury: Teeth and Oropharynx as per pre-operative assessment

## 2019-10-31 NOTE — Anesthesia Postprocedure Evaluation (Signed)
Anesthesia Post Note  Patient: Danny Conner  Procedure(s) Performed: DENTAL RESTORATION/EXTRACTIONS TEETH NUMBERS THREE, FOUR, FIVE, SIX, SEVEN, EIGHT,NINE, TEN, ELEVEN, TWELVE, FOURTEEN, FIFTEEN, TWENTY TWO, ALVEOLPLASTY (N/A Mouth)     Patient location during evaluation: PACU Anesthesia Type: General Level of consciousness: sedated Pain management: pain level controlled Vital Signs Assessment: post-procedure vital signs reviewed and stable Respiratory status: spontaneous breathing and respiratory function stable Cardiovascular status: stable Postop Assessment: no apparent nausea or vomiting Anesthetic complications: no   No complications documented.  Last Vitals:  Vitals:   10/31/19 1300 10/31/19 1315  BP: (!) 174/93 (!) 151/102  Pulse: 62 65  Resp: 20 16  Temp:    SpO2: 100% 94%    Last Pain:  Vitals:   10/31/19 1315  TempSrc:   PainSc: Asleep                 Mellody Dance

## 2019-10-31 NOTE — H&P (Signed)
H&P documentation  -History and Physical Reviewed  -Patient has been re-examined  -No change in the plan of care  Danny Conner  

## 2019-10-31 NOTE — Transfer of Care (Signed)
Immediate Anesthesia Transfer of Care Note  Patient: Danny Conner  Procedure(s) Performed: DENTAL RESTORATION/EXTRACTIONS TEETH NUMBERS THREE, FOUR, FIVE, SIX, SEVEN, EIGHT,NINE, TEN, ELEVEN, TWELVE, FOURTEEN, FIFTEEN, TWENTY TWO, ALVEOLPLASTY (N/A Mouth)  Patient Location: PACU  Anesthesia Type:General  Level of Consciousness: drowsy and patient cooperative  Airway & Oxygen Therapy: Patient Spontanous Breathing and Patient connected to face mask oxygen  Post-op Assessment: Report given to RN and Post -op Vital signs reviewed and stable  Post vital signs: Reviewed and stable  Last Vitals:  Vitals Value Taken Time  BP 179/93 10/31/19 1227  Temp 36.1 C 10/31/19 1227  Pulse 71 10/31/19 1230  Resp 14 10/31/19 1230  SpO2 100 % 10/31/19 1230  Vitals shown include unvalidated device data.  Last Pain:  Vitals:   10/31/19 1227  TempSrc:   PainSc: Asleep         Complications: No complications documented.

## 2019-11-01 ENCOUNTER — Encounter (HOSPITAL_COMMUNITY): Payer: Self-pay | Admitting: Oral Surgery

## 2019-12-28 ENCOUNTER — Ambulatory Visit: Payer: Medicare Other | Admitting: *Deleted

## 2019-12-28 ENCOUNTER — Ambulatory Visit: Payer: Medicare Other | Attending: Critical Care Medicine

## 2019-12-28 DIAGNOSIS — Z23 Encounter for immunization: Secondary | ICD-10-CM

## 2019-12-28 NOTE — Progress Notes (Signed)
Patient presents for vaccine injection today. Patient tolerated injection well and was observed without any concerns.  

## 2020-01-16 ENCOUNTER — Emergency Department (HOSPITAL_BASED_OUTPATIENT_CLINIC_OR_DEPARTMENT_OTHER)
Admission: EM | Admit: 2020-01-16 | Discharge: 2020-01-17 | Disposition: A | Discharge status: Home / Self Care | Payer: Medicare Other | Attending: Emergency Medicine | Admitting: Emergency Medicine

## 2020-01-16 ENCOUNTER — Other Ambulatory Visit: Payer: Self-pay

## 2020-01-16 ENCOUNTER — Encounter (HOSPITAL_BASED_OUTPATIENT_CLINIC_OR_DEPARTMENT_OTHER): Payer: Self-pay | Admitting: Emergency Medicine

## 2020-01-16 ENCOUNTER — Emergency Department (HOSPITAL_BASED_OUTPATIENT_CLINIC_OR_DEPARTMENT_OTHER): Payer: Medicare Other

## 2020-01-16 DIAGNOSIS — Z7982 Long term (current) use of aspirin: Secondary | ICD-10-CM | POA: Insufficient documentation

## 2020-01-16 DIAGNOSIS — J45909 Unspecified asthma, uncomplicated: Secondary | ICD-10-CM | POA: Insufficient documentation

## 2020-01-16 DIAGNOSIS — W540XXA Bitten by dog, initial encounter: Secondary | ICD-10-CM | POA: Diagnosis not present

## 2020-01-16 DIAGNOSIS — I10 Essential (primary) hypertension: Secondary | ICD-10-CM | POA: Insufficient documentation

## 2020-01-16 DIAGNOSIS — F17210 Nicotine dependence, cigarettes, uncomplicated: Secondary | ICD-10-CM | POA: Diagnosis not present

## 2020-01-16 DIAGNOSIS — I2510 Atherosclerotic heart disease of native coronary artery without angina pectoris: Secondary | ICD-10-CM | POA: Diagnosis not present

## 2020-01-16 DIAGNOSIS — Z79899 Other long term (current) drug therapy: Secondary | ICD-10-CM | POA: Diagnosis not present

## 2020-01-16 DIAGNOSIS — S61452A Open bite of left hand, initial encounter: Secondary | ICD-10-CM | POA: Insufficient documentation

## 2020-01-16 MED ORDER — SODIUM CHLORIDE 0.9 % IV SOLN
3.0000 g | Freq: Once | INTRAVENOUS | Status: AC
  Administered 2020-01-16: 3 g via INTRAVENOUS
  Filled 2020-01-16: qty 8

## 2020-01-16 MED ORDER — LIDOCAINE HCL (PF) 1 % IJ SOLN
10.0000 mL | Freq: Once | INTRAMUSCULAR | Status: AC
  Administered 2020-01-16: 10 mL
  Filled 2020-01-16: qty 10

## 2020-01-16 MED ORDER — TRAMADOL HCL 50 MG PO TABS
50.0000 mg | ORAL_TABLET | Freq: Once | ORAL | Status: AC
  Administered 2020-01-16: 50 mg via ORAL
  Filled 2020-01-16: qty 1

## 2020-01-16 MED ORDER — AMOXICILLIN-POT CLAVULANATE 875-125 MG PO TABS: 1.0000 | ORAL_TABLET | Freq: Two times a day (BID) | ORAL | 0 refills | Status: DC

## 2020-01-16 MED ORDER — TRAMADOL HCL 50 MG PO TABS
50.0000 mg | ORAL_TABLET | Freq: Three times a day (TID) | ORAL | 0 refills | Status: DC | PRN
Start: 2020-01-16 — End: 2021-06-23

## 2020-01-16 NOTE — ED Triage Notes (Addendum)
Pt bit by own dog on left hand about three days ago.  Pt states immunizations are up to date.  Pt having left hand swelling, abrasions and puncture wounds noted.  Also noted some bruising.

## 2020-01-16 NOTE — Discharge Instructions (Addendum)
Please take antibiotics twice daily with food to help treat infection.  For pain you can take Tylenol 1000 mg every 6 hours, for breakthrough severe pain you may take tramadol up to every 8 hours, this medication can cause drowsiness and should not be taken before driving.  Please call to schedule follow-up appointment with Dr. Janee Morn with hand surgery.  If you have worsening swelling, increasing pain in the hand, redness tracking up to your hand or arm, fevers or any other new or worsening symptoms please return immediately to the emergency department.  You will need to be seen in 48 hours either with Dr. Janee Morn or in the emergency department for a wound check.
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
## 2020-01-16 NOTE — ED Notes (Signed)
Please call patient's daughter Morrie Sheldon [phone] when ready to go home.

## 2020-01-16 NOTE — ED Notes (Signed)
Pt. Has wound on the L hand from dog bite several days ago and is here for pain and poss. Infection.  Pt. Reports the bite is not getting better.  Pt. Is in no distress.

## 2020-01-18 ENCOUNTER — Ambulatory Visit (HOSPITAL_COMMUNITY)
Admission: EM | Admit: 2020-01-18 | Discharge: 2020-01-18 | Disposition: A | Discharge status: Home / Self Care | Payer: Medicare Other | Attending: Internal Medicine | Admitting: Internal Medicine

## 2020-01-18 ENCOUNTER — Encounter (HOSPITAL_COMMUNITY): Payer: Self-pay

## 2020-01-18 DIAGNOSIS — Z5189 Encounter for other specified aftercare: Secondary | ICD-10-CM

## 2020-01-18 MED ORDER — MUPIROCIN CALCIUM 2 % EX CREA: TOPICAL_CREAM | CUTANEOUS | 0 refills | Status: AC
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
## 2020-01-18 NOTE — ED Triage Notes (Signed)
Pt in to have packing removed from from right 3rd finger from where he was bit by a dog a few days ago  Active yellow drainage noted

## 2020-01-18 NOTE — Discharge Instructions (Signed)
The infection seems to be improving when compared to picture from the ER. continue to keep area clean and dry.  Apply prescribed ointment twice daily.  I have given you a referral for PCP if you do not wish to see your current PCP.  However, need to follow-up in the next week.  Please return or go to ER for any worsening swelling, redness or drainage.

## 2020-04-24 ENCOUNTER — Emergency Department (HOSPITAL_COMMUNITY)
Admission: EM | Admit: 2020-04-24 | Discharge: 2020-04-25 | Disposition: A | Discharge status: Home / Self Care | Payer: Medicare Other | Attending: Emergency Medicine | Admitting: Emergency Medicine

## 2020-04-24 ENCOUNTER — Emergency Department (HOSPITAL_COMMUNITY): Payer: Medicare Other

## 2020-04-24 ENCOUNTER — Other Ambulatory Visit: Payer: Self-pay

## 2020-04-24 ENCOUNTER — Encounter (HOSPITAL_COMMUNITY): Payer: Self-pay

## 2020-04-24 ENCOUNTER — Ambulatory Visit (INDEPENDENT_AMBULATORY_CARE_PROVIDER_SITE_OTHER)
Admission: EM | Admit: 2020-04-24 | Discharge: 2020-04-24 | Disposition: A | Discharge status: Home / Self Care | Payer: Medicare Other | Source: Home / Self Care

## 2020-04-24 DIAGNOSIS — J45909 Unspecified asthma, uncomplicated: Secondary | ICD-10-CM | POA: Insufficient documentation

## 2020-04-24 DIAGNOSIS — E871 Hypo-osmolality and hyponatremia: Secondary | ICD-10-CM | POA: Insufficient documentation

## 2020-04-24 DIAGNOSIS — R1013 Epigastric pain: Secondary | ICD-10-CM | POA: Insufficient documentation

## 2020-04-24 DIAGNOSIS — R109 Unspecified abdominal pain: Secondary | ICD-10-CM | POA: Diagnosis present

## 2020-04-24 DIAGNOSIS — Z79899 Other long term (current) drug therapy: Secondary | ICD-10-CM | POA: Diagnosis not present

## 2020-04-24 DIAGNOSIS — R1012 Left upper quadrant pain: Secondary | ICD-10-CM | POA: Insufficient documentation

## 2020-04-24 DIAGNOSIS — K219 Gastro-esophageal reflux disease without esophagitis: Secondary | ICD-10-CM | POA: Insufficient documentation

## 2020-04-24 DIAGNOSIS — Z7982 Long term (current) use of aspirin: Secondary | ICD-10-CM | POA: Insufficient documentation

## 2020-04-24 DIAGNOSIS — R1011 Right upper quadrant pain: Secondary | ICD-10-CM | POA: Diagnosis not present

## 2020-04-24 DIAGNOSIS — F17210 Nicotine dependence, cigarettes, uncomplicated: Secondary | ICD-10-CM | POA: Insufficient documentation

## 2020-04-24 DIAGNOSIS — I2510 Atherosclerotic heart disease of native coronary artery without angina pectoris: Secondary | ICD-10-CM | POA: Insufficient documentation

## 2020-04-24 DIAGNOSIS — I10 Essential (primary) hypertension: Secondary | ICD-10-CM | POA: Insufficient documentation

## 2020-04-24 DIAGNOSIS — R1010 Upper abdominal pain, unspecified: Secondary | ICD-10-CM

## 2020-04-24 LAB — CBC WITH DIFFERENTIAL/PLATELET
Abs Immature Granulocytes: 0.04 10*3/uL (ref 0.00–0.07)
Abs Immature Granulocytes: 0.02 10*3/uL (ref 0.00–0.07)
Basophils Absolute: 0 10*3/uL (ref 0.0–0.1)
Basophils Absolute: 0 10*3/uL (ref 0.0–0.1)
Basophils Relative: 0 %
Basophils Relative: 0 %
Eosinophils Absolute: 0.1 10*3/uL (ref 0.0–0.5)
Eosinophils Absolute: 0.2 10*3/uL (ref 0.0–0.5)
Eosinophils Relative: 1 %
Eosinophils Relative: 2 %
HCT: 42.1 % (ref 39.0–52.0)
HCT: 40.4 % (ref 39.0–52.0)
Hemoglobin: 14 g/dL (ref 13.0–17.0)
Hemoglobin: 13.3 g/dL (ref 13.0–17.0)
Immature Granulocytes: 1 %
Immature Granulocytes: 0 %
Lymphocytes Relative: 21 %
Lymphocytes Relative: 24 %
Lymphs Abs: 1.5 10*3/uL (ref 0.7–4.0)
Lymphs Abs: 1.9 10*3/uL (ref 0.7–4.0)
MCH: 31.5 pg (ref 26.0–34.0)
MCH: 31.5 pg (ref 26.0–34.0)
MCHC: 33.3 g/dL (ref 30.0–36.0)
MCHC: 32.9 g/dL (ref 30.0–36.0)
MCV: 94.6 fL (ref 80.0–100.0)
MCV: 95.7 fL (ref 80.0–100.0)
Monocytes Absolute: 1 10*3/uL (ref 0.1–1.0)
Monocytes Absolute: 1.2 10*3/uL — ABNORMAL HIGH (ref 0.1–1.0)
Monocytes Relative: 14 %
Monocytes Relative: 15 %
Neutro Abs: 4.5 10*3/uL (ref 1.7–7.7)
Neutro Abs: 4.8 10*3/uL (ref 1.7–7.7)
Neutrophils Relative %: 63 %
Neutrophils Relative %: 59 %
Platelets: 200 10*3/uL (ref 150–400)
Platelets: 201 10*3/uL (ref 150–400)
RBC: 4.45 MIL/uL (ref 4.22–5.81)
RBC: 4.22 MIL/uL (ref 4.22–5.81)
RDW: 12.7 % (ref 11.5–15.5)
RDW: 12.8 % (ref 11.5–15.5)
WBC: 7.1 10*3/uL (ref 4.0–10.5)
WBC: 8.1 10*3/uL (ref 4.0–10.5)
nRBC: 0 % (ref 0.0–0.2)
nRBC: 0 % (ref 0.0–0.2)

## 2020-04-24 LAB — COMPREHENSIVE METABOLIC PANEL
ALT: 76 U/L — ABNORMAL HIGH (ref 0–44)
ALT: 75 U/L — ABNORMAL HIGH (ref 0–44)
AST: 96 U/L — ABNORMAL HIGH (ref 15–41)
AST: 91 U/L — ABNORMAL HIGH (ref 15–41)
Albumin: 3.9 g/dL (ref 3.5–5.0)
Albumin: 3.7 g/dL (ref 3.5–5.0)
Alkaline Phosphatase: 48 U/L (ref 38–126)
Alkaline Phosphatase: 47 U/L (ref 38–126)
Anion gap: 8 (ref 5–15)
Anion gap: 9 (ref 5–15)
BUN: 18 mg/dL (ref 8–23)
BUN: 21 mg/dL (ref 8–23)
CO2: 25 mmol/L (ref 22–32)
CO2: 23 mmol/L (ref 22–32)
Calcium: 9.6 mg/dL (ref 8.9–10.3)
Calcium: 9.6 mg/dL (ref 8.9–10.3)
Chloride: 99 mmol/L (ref 98–111)
Chloride: 100 mmol/L (ref 98–111)
Creatinine, Ser: 0.9 mg/dL (ref 0.61–1.24)
Creatinine, Ser: 0.88 mg/dL (ref 0.61–1.24)
GFR, Estimated: >60 mL/min (ref >60)
GFR, Estimated: >60 mL/min (ref >60)
Glucose, Bld: 97 mg/dL (ref 70–99)
Glucose, Bld: 101 mg/dL — ABNORMAL HIGH (ref 70–99)
Potassium: 4.7 mmol/L (ref 3.5–5.1)
Potassium: 4.4 mmol/L (ref 3.5–5.1)
Sodium: 132 mmol/L — ABNORMAL LOW (ref 135–145)
Sodium: 132 mmol/L — ABNORMAL LOW (ref 135–145)
Total Bilirubin: 0.3 mg/dL (ref 0.3–1.2)
Total Bilirubin: 0.5 mg/dL (ref 0.3–1.2)
Total Protein: 8.1 g/dL (ref 6.5–8.1)
Total Protein: 7.7 g/dL (ref 6.5–8.1)

## 2020-04-24 LAB — URINALYSIS, ROUTINE W REFLEX MICROSCOPIC
Bilirubin Urine: NEGATIVE
Glucose, UA: NEGATIVE mg/dL
Hgb urine dipstick: NEGATIVE
Ketones, ur: 5 mg/dL — AB
Nitrite: NEGATIVE
Protein, ur: NEGATIVE mg/dL
Specific Gravity, Urine: 1.024 (ref 1.005–1.030)
pH: 6 (ref 5.0–8.0)

## 2020-04-24 LAB — POCT URINALYSIS DIPSTICK, ED / UC
Glucose, UA: NEGATIVE mg/dL
Nitrite: NEGATIVE
Protein, ur: NEGATIVE mg/dL
Specific Gravity, Urine: 1.025 (ref 1.005–1.030)
Urobilinogen, UA: 0.2 mg/dL (ref 0.0–1.0)
pH: 5.5 (ref 5.0–8.0)

## 2020-04-24 LAB — LIPASE, BLOOD
Lipase: 62 U/L — ABNORMAL HIGH (ref 11–51)
Lipase: 76 U/L — ABNORMAL HIGH (ref 11–51)

## 2020-04-24 MED ORDER — IOHEXOL 300 MG/ML  SOLN
100.0000 mL | Freq: Once | INTRAMUSCULAR | Status: AC | PRN
  Administered 2020-04-24: 100 mL via INTRAVENOUS
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
## 2020-04-24 NOTE — ED Triage Notes (Signed)
Abdominal pain x 3days, denies any nausea, vomiting, diarrhea and painful urination.   Last alcoholic drink was 04/22/20. Usually drinks half a pint everyday.

## 2020-04-24 NOTE — ED Triage Notes (Signed)
Emergency Medicine Provider Triage Evaluation Note  Danny Conner , a 65 y.o. male  was evaluated in triage.  Pt complains of epigastric abdominal pain since Sunday after drinking alcohol. Pain is sharp. No associated symptoms.   Review of Systems  Positive: Epigastric pain Negative: N,v,d, fever  Physical Exam  Ht 5\' 7"  (1.702 m)   Wt 68 kg   BMI 23.48 kg/m  Gen:   Awake, no distress   HEENT:  Atraumatic  Resp:  Normal effort  Cardiac:  Normal rate Abd:   Guarding to epigastric area  MSK:   Moves extremities without difficulty  Neuro:  Speech clear   Medical Decision Making  Medically screening exam initiated at 9:49 PM.  Appropriate orders placed.  Darell Saputo was informed that the remainder of the evaluation will be completed by another provider, this initial triage assessment does not replace that evaluation, and the importance of remaining in the ED until their evaluation is complete.  Clinical Impression  MSE was initiated and I personally evaluated the patient and placed orders (if any) at  9:50 PM on April 24, 2020.  The patient appears stable so that the remainder of the MSE may be completed by another provider.    April 26, 2020, PA-C 04/24/20 2150

## 2020-04-24 NOTE — ED Triage Notes (Signed)
Pt reports epigastric pain x 4 days. States pain started after drinking liquor. Reports pain "come and go". Denies diarrhea, nausea, vomiting, chest pain, dizziness.   Pt reports he did not drink alchohol for the past 4 months.   Pt reports lower back pain x 6 months. Pt reports he was taking ibuprofen for the past 5 months for the back pain and switched to Tylenol 5 weeks ago.

## 2020-04-24 NOTE — Discharge Instructions (Addendum)
I do recommend that you go to the emergency room.  If any of your lab work is abnormal you will need to go, or if your pain continues until tomorrow without improvement, or if you have any worsening symptoms.  We will be in touch with the results once we have them.  Eat a bland diet and drink plenty of fluid.

## 2020-04-25 ENCOUNTER — Encounter (HOSPITAL_COMMUNITY): Payer: Self-pay | Admitting: Student

## 2020-04-25 DIAGNOSIS — R1011 Right upper quadrant pain: Secondary | ICD-10-CM | POA: Diagnosis not present

## 2020-04-25 LAB — URINE CULTURE: Culture: NO GROWTH

## 2020-04-25 MED ORDER — SUCRALFATE 1 GM/10ML PO SUSP: 1.0000 g | Freq: Three times a day (TID) | ORAL | 0 refills | Status: DC

## 2020-04-25 MED ORDER — FAMOTIDINE 20 MG PO TABS: 20.0000 mg | ORAL_TABLET | Freq: Two times a day (BID) | ORAL | 0 refills | Status: DC | PRN

## 2020-04-25 MED ORDER — ALUM & MAG HYDROXIDE-SIMETH 200-200-20 MG/5ML PO SUSP
30.0000 mL | Freq: Once | ORAL | Status: AC
  Administered 2020-04-25: 30 mL via ORAL
  Filled 2020-04-25: qty 30

## 2020-04-25 MED ORDER — PANTOPRAZOLE SODIUM 40 MG IV SOLR
40.0000 mg | Freq: Once | INTRAVENOUS | Status: AC
  Administered 2020-04-25: 40 mg via INTRAVENOUS
  Filled 2020-04-25: qty 40

## 2020-04-25 MED ORDER — LIDOCAINE VISCOUS HCL 2 % MT SOLN
15.0000 mL | Freq: Once | OROMUCOSAL | Status: AC
  Administered 2020-04-25: 15 mL via ORAL
  Filled 2020-04-25: qty 15
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
## 2020-04-25 NOTE — Discharge Instructions (Signed)
You were seen in the emergency department today for abdominal pain.  Your blood work was similar to prior labs you have had done with mildly elevated liver function test as well as a mildly elevated lipase, which looks at your pancreas and mildly low sodium level.  Please have these rechecked by your primary care provider. We are sending you home with the following medications to help with your symptoms:  - Pepcid- take twice per day as needed for pain.  - Carafate- please take prior to each meal and prior to bedtime to help with stomach acidity/pain.    We have prescribed you new medication(s) today. Discuss the medications prescribed today with your pharmacist as they can have adverse effects and interactions with your other medicines including over the counter and prescribed medications. Seek medical evaluation if you start to experience new or abnormal symptoms after taking one of these medicines, seek care immediately if you start to experience difficulty breathing, feeling of your throat closing, facial swelling, or rash as these could be indications of a more serious allergic reaction  Please follow attached diet guidelines.  Avoid nonsteroidal anti-inflammatory medication such as Motrin, Advil, Aleve, Goody powder, naproxen, etc. as these can further irritate your stomach.  Please try to avoid alcohol as this can also irritate the stomach.  Follow up with your primary care provider and/or gastroenterology within 3 days for re-evaluation.  Return to the ER for new or worsening symptoms including but not limited to worsened pain, new pain, inability to keep fluids down, blood in vomit/stool, passing out, or any other concerns.

## 2020-10-08 ENCOUNTER — Other Ambulatory Visit: Payer: Self-pay

## 2020-10-08 ENCOUNTER — Emergency Department (HOSPITAL_COMMUNITY)
Admission: EM | Admit: 2020-10-08 | Discharge: 2020-10-08 | Disposition: A | Discharge status: Home / Self Care | Payer: Medicare Other | Attending: Emergency Medicine | Admitting: Emergency Medicine

## 2020-10-08 ENCOUNTER — Emergency Department (HOSPITAL_COMMUNITY): Payer: Medicare Other

## 2020-10-08 ENCOUNTER — Encounter (HOSPITAL_COMMUNITY): Payer: Self-pay

## 2020-10-08 DIAGNOSIS — F17210 Nicotine dependence, cigarettes, uncomplicated: Secondary | ICD-10-CM | POA: Diagnosis not present

## 2020-10-08 DIAGNOSIS — I10 Essential (primary) hypertension: Secondary | ICD-10-CM | POA: Insufficient documentation

## 2020-10-08 DIAGNOSIS — R0781 Pleurodynia: Secondary | ICD-10-CM | POA: Insufficient documentation

## 2020-10-08 DIAGNOSIS — Z79899 Other long term (current) drug therapy: Secondary | ICD-10-CM | POA: Diagnosis not present

## 2020-10-08 DIAGNOSIS — Z7902 Long term (current) use of antithrombotics/antiplatelets: Secondary | ICD-10-CM | POA: Diagnosis not present

## 2020-10-08 DIAGNOSIS — Z20822 Contact with and (suspected) exposure to covid-19: Secondary | ICD-10-CM | POA: Insufficient documentation

## 2020-10-08 DIAGNOSIS — R109 Unspecified abdominal pain: Secondary | ICD-10-CM | POA: Diagnosis not present

## 2020-10-08 DIAGNOSIS — Z7982 Long term (current) use of aspirin: Secondary | ICD-10-CM | POA: Insufficient documentation

## 2020-10-08 DIAGNOSIS — J45909 Unspecified asthma, uncomplicated: Secondary | ICD-10-CM | POA: Insufficient documentation

## 2020-10-08 DIAGNOSIS — R630 Anorexia: Secondary | ICD-10-CM | POA: Diagnosis not present

## 2020-10-08 DIAGNOSIS — I2510 Atherosclerotic heart disease of native coronary artery without angina pectoris: Secondary | ICD-10-CM | POA: Insufficient documentation

## 2020-10-08 DIAGNOSIS — J101 Influenza due to other identified influenza virus with other respiratory manifestations: Secondary | ICD-10-CM | POA: Insufficient documentation

## 2020-10-08 DIAGNOSIS — R059 Cough, unspecified: Secondary | ICD-10-CM | POA: Diagnosis present

## 2020-10-08 LAB — BASIC METABOLIC PANEL
Anion gap: 11 (ref 5–15)
BUN: 15 mg/dL (ref 8–23)
CO2: 23 mmol/L (ref 22–32)
Calcium: 9.2 mg/dL (ref 8.9–10.3)
Chloride: 99 mmol/L (ref 98–111)
Creatinine, Ser: 1.07 mg/dL (ref 0.61–1.24)
GFR, Estimated: >60 mL/min (ref >60)
Glucose, Bld: 124 mg/dL — ABNORMAL HIGH (ref 70–99)
Potassium: 3.8 mmol/L (ref 3.5–5.1)
Sodium: 133 mmol/L — ABNORMAL LOW (ref 135–145)

## 2020-10-08 LAB — CBC WITH DIFFERENTIAL/PLATELET
Abs Immature Granulocytes: 0.01 10*3/uL (ref 0.00–0.07)
Basophils Absolute: 0 10*3/uL (ref 0.0–0.1)
Basophils Relative: 0 %
Eosinophils Absolute: 0 10*3/uL (ref 0.0–0.5)
Eosinophils Relative: 0 %
HCT: 41.6 % (ref 39.0–52.0)
Hemoglobin: 13.3 g/dL (ref 13.0–17.0)
Immature Granulocytes: 0 %
Lymphocytes Relative: 22 %
Lymphs Abs: 1 10*3/uL (ref 0.7–4.0)
MCH: 29.8 pg (ref 26.0–34.0)
MCHC: 32 g/dL (ref 30.0–36.0)
MCV: 93.1 fL (ref 80.0–100.0)
Monocytes Absolute: 0.7 10*3/uL (ref 0.1–1.0)
Monocytes Relative: 16 %
Neutro Abs: 2.7 10*3/uL (ref 1.7–7.7)
Neutrophils Relative %: 62 %
Platelets: 173 10*3/uL (ref 150–400)
RBC: 4.47 MIL/uL (ref 4.22–5.81)
RDW: 12.9 % (ref 11.5–15.5)
WBC: 4.5 10*3/uL (ref 4.0–10.5)
nRBC: 0 % (ref 0.0–0.2)

## 2020-10-08 LAB — RESP PANEL BY RT-PCR (FLU A&B, COVID) ARPGX2
Influenza A by PCR: POSITIVE — AB
Influenza B by PCR: NEGATIVE
SARS Coronavirus 2 by RT PCR: NEGATIVE

## 2020-10-08 MED ORDER — OSELTAMIVIR PHOSPHATE 75 MG PO CAPS: 75.0000 mg | ORAL_CAPSULE | Freq: Two times a day (BID) | ORAL | 0 refills | Status: DC

## 2020-10-08 NOTE — ED Provider Notes (Signed)
Emergency Medicine Provider Triage Evaluation Note  Danny Conner , a 65 y.o. male  was evaluated in triage.  Pt complains of fevers, chills, body aches, cough.  Symptoms present for the past 4 days, wife here with similar symptoms as well.  They have not been tested for COVID.  Unsure of other sick contacts.  Reports pain in particular with cough and movement over the right side of his ribs.  No vomiting  Review of Systems  Positive: Fevers, chills, cough, body aches Negative: Vomiting, abdominal pain  Physical Exam  BP 109/78   Pulse 86   Temp 98.2 F (36.8 C) (Oral)   Resp 18   SpO2 100%  Gen:   Awake, no distress   Resp:  Normal effort, faint expiratory wheezing bilaterally MSK:   Moves extremities without difficulty  Other:    Medical Decision Making  Medically screening exam initiated at 6:35 PM.  Appropriate orders placed.  Danny Conner was informed that the remainder of the evaluation will be completed by another provider, this initial triage assessment does not replace that evaluation, and the importance of remaining in the ED until their evaluation is complete.     Dartha Lodge, PA-C 10/08/20 1846    [name], MD 10/11/20 1759
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
## 2020-10-08 NOTE — Discharge Instructions (Addendum)
Try eating more calories and drinking less alcohol.  Continue to do Tylenol and ibuprofen for the body aches and pains.  Make sure you are staying hydrated.  If you start having severe shortness of breath, passing out, confusion or other concerns return to the emergency room

## 2020-10-08 NOTE — ED Notes (Signed)
An After Visit Summary was printed and given to the patient. Discharge instructions given and no further questions at this time.  

## 2020-10-08 NOTE — ED Triage Notes (Signed)
Pt c/o headache x3 days. Pt c/o body aches, abdominal pain. Pt c/o cough, runny nose, fever x3 days.

## 2020-10-17 ENCOUNTER — Other Ambulatory Visit: Payer: Self-pay

## 2020-10-17 ENCOUNTER — Emergency Department (HOSPITAL_COMMUNITY)
Admission: EM | Admit: 2020-10-17 | Discharge: 2020-10-17 | Discharge status: Against Medical Advice | Payer: Medicare Other | Attending: Emergency Medicine | Admitting: Emergency Medicine

## 2020-10-17 ENCOUNTER — Emergency Department (HOSPITAL_COMMUNITY): Payer: Medicare Other

## 2020-10-17 ENCOUNTER — Encounter (HOSPITAL_COMMUNITY): Payer: Self-pay | Admitting: Emergency Medicine

## 2020-10-17 DIAGNOSIS — I2510 Atherosclerotic heart disease of native coronary artery without angina pectoris: Secondary | ICD-10-CM | POA: Insufficient documentation

## 2020-10-17 DIAGNOSIS — F17210 Nicotine dependence, cigarettes, uncomplicated: Secondary | ICD-10-CM | POA: Diagnosis not present

## 2020-10-17 DIAGNOSIS — Z79899 Other long term (current) drug therapy: Secondary | ICD-10-CM | POA: Insufficient documentation

## 2020-10-17 DIAGNOSIS — Z7902 Long term (current) use of antithrombotics/antiplatelets: Secondary | ICD-10-CM | POA: Diagnosis not present

## 2020-10-17 DIAGNOSIS — R059 Cough, unspecified: Secondary | ICD-10-CM | POA: Diagnosis present

## 2020-10-17 DIAGNOSIS — I10 Essential (primary) hypertension: Secondary | ICD-10-CM | POA: Diagnosis not present

## 2020-10-17 DIAGNOSIS — J189 Pneumonia, unspecified organism: Secondary | ICD-10-CM

## 2020-10-17 DIAGNOSIS — J181 Lobar pneumonia, unspecified organism: Secondary | ICD-10-CM | POA: Insufficient documentation

## 2020-10-17 LAB — CBC WITH DIFFERENTIAL/PLATELET
Abs Immature Granulocytes: 0.08 10*3/uL — ABNORMAL HIGH (ref 0.00–0.07)
Basophils Absolute: 0 10*3/uL (ref 0.0–0.1)
Basophils Relative: 0 %
Eosinophils Absolute: 0.1 10*3/uL (ref 0.0–0.5)
Eosinophils Relative: 1 %
HCT: 36.9 % — ABNORMAL LOW (ref 39.0–52.0)
Hemoglobin: 11.9 g/dL — ABNORMAL LOW (ref 13.0–17.0)
Immature Granulocytes: 1 %
Lymphocytes Relative: 18 %
Lymphs Abs: 2.3 10*3/uL (ref 0.7–4.0)
MCH: 30.4 pg (ref 26.0–34.0)
MCHC: 32.2 g/dL (ref 30.0–36.0)
MCV: 94.1 fL (ref 80.0–100.0)
Monocytes Absolute: 2 10*3/uL — ABNORMAL HIGH (ref 0.1–1.0)
Monocytes Relative: 15 %
Neutro Abs: 8.8 10*3/uL — ABNORMAL HIGH (ref 1.7–7.7)
Neutrophils Relative %: 65 %
Platelets: 334 10*3/uL (ref 150–400)
RBC: 3.92 MIL/uL — ABNORMAL LOW (ref 4.22–5.81)
RDW: 12.9 % (ref 11.5–15.5)
WBC: 13.3 10*3/uL — ABNORMAL HIGH (ref 4.0–10.5)
nRBC: 0 % (ref 0.0–0.2)

## 2020-10-17 LAB — BASIC METABOLIC PANEL
Anion gap: 10 (ref 5–15)
BUN: 12 mg/dL (ref 8–23)
CO2: 24 mmol/L (ref 22–32)
Calcium: 9.8 mg/dL (ref 8.9–10.3)
Chloride: 106 mmol/L (ref 98–111)
Creatinine, Ser: 0.78 mg/dL (ref 0.61–1.24)
GFR, Estimated: >60 mL/min (ref >60)
Glucose, Bld: 89 mg/dL (ref 70–99)
Potassium: 4.5 mmol/L (ref 3.5–5.1)
Sodium: 140 mmol/L (ref 135–145)

## 2020-10-17 MED ORDER — ALBUTEROL SULFATE HFA 108 (90 BASE) MCG/ACT IN AERS: 2.0000 | INHALATION_SPRAY | RESPIRATORY_TRACT | Status: DC | PRN

## 2020-10-17 MED ORDER — ACETAMINOPHEN 500 MG PO TABS: 1000.0000 mg | ORAL_TABLET | Freq: Once | ORAL | Status: DC

## 2020-10-17 MED ORDER — SODIUM CHLORIDE 0.9 % IV SOLN
500.0000 mg | Freq: Once | INTRAVENOUS | Status: AC
  Administered 2020-10-17: 500 mg via INTRAVENOUS
  Filled 2020-10-17: qty 500

## 2020-10-17 MED ORDER — SODIUM CHLORIDE 0.9 % IV SOLN
1.0000 g | Freq: Once | INTRAVENOUS | Status: AC
  Administered 2020-10-17: 1 g via INTRAVENOUS
  Filled 2020-10-17: qty 10

## 2020-10-17 MED ORDER — AMOXICILLIN-POT CLAVULANATE 875-125 MG PO TABS: 1.0000 | ORAL_TABLET | Freq: Two times a day (BID) | ORAL | 0 refills | Status: DC

## 2020-10-17 MED ORDER — LIDOCAINE VISCOUS HCL 2 % MT SOLN
15.0000 mL | Freq: Once | OROMUCOSAL | Status: AC
  Administered 2020-10-17: 15 mL via OROMUCOSAL
  Filled 2020-10-17: qty 15

## 2020-10-17 NOTE — ED Triage Notes (Signed)
Per pt, states he has been seen here and at [facility] for the same symptoms-right rib cage pain, productive cough with yellow sputum-SOB, lethargy-states he was diagnosed with a viral infection-states his wife has the same thing-states he has not getting better
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
## 2020-10-17 NOTE — ED Notes (Signed)
Went to room when I heard yelling. Patient noted to be in RNs face, agitated and upset about his care. This RN stepped in, and patient states, "Do you see this lump in my arm?" Explained to patient he has a hematoma from pulling out his IV and it not being removed properly. Patient states, "Bull shit, my arm has been burning and I don't have to put up with this shit." Informed patient that he can sign AMA. Patient refused as witnessed by this RN and primary RN to sign for AMA.

## 2020-10-17 NOTE — ED Provider Notes (Signed)
Emergency Medicine Provider Triage Evaluation Note  Danny Conner , a 65 y.o. male  was evaluated in triage.  Pt complains of ongoing cough and feeling poorly.  He was seen here on the third when he tested positive for influenza A.  He has since been seen awake and is back here. He reports that he is still feeling poorly.  He was prescribed Tamiflu but did not take it.  He reports cough, fevers, sweating at night.  He states that he wants something for his pain stating that no one has given him anything.  Review of Systems  Positive: Fever, cough, shortness of breath Negative: Syncope  Physical Exam  BP 120/81   Pulse 86   Temp 99.5 F (37.5 C) (Oral)   Resp 18   SpO2 98%  Gen:   Awake, no distress   Resp:  Normal effort  MSK:   Moves extremities without difficulty  Other:  Normal gait.  Diffuse rhonchi mild bialterally.   Medical Decision Making  Medically screening exam initiated at 3:34 PM.  Appropriate orders placed.  Danny Conner was informed that the remainder of the evaluation will be completed by another provider, this initial triage assessment does not replace that evaluation, and the importance of remaining in the ED until their evaluation is complete.  Note: Portions of this report may have been transcribed using voice recognition software. Every effort was made to ensure accuracy; however, inadvertent computerized transcription errors may be present    Cristina Gong, PA-C 10/17/20 1536    Mancel Bale, MD 10/17/20 [phone]
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
## 2021-06-23 ENCOUNTER — Ambulatory Visit (HOSPITAL_COMMUNITY): Payer: Medicare Other

## 2021-06-23 ENCOUNTER — Ambulatory Visit (INDEPENDENT_AMBULATORY_CARE_PROVIDER_SITE_OTHER): Payer: Medicare Other

## 2021-06-23 ENCOUNTER — Ambulatory Visit
Admission: RE | Admit: 2021-06-23 | Discharge: 2021-06-23 | Disposition: A | Discharge status: Home / Self Care | Payer: Medicare Other | Source: Ambulatory Visit | Attending: Emergency Medicine | Admitting: Emergency Medicine

## 2021-06-23 VITALS — BP 121/73 | HR 77 | Temp 98.2°F | Resp 22

## 2021-06-23 DIAGNOSIS — R0602 Shortness of breath: Secondary | ICD-10-CM

## 2021-06-23 DIAGNOSIS — J22 Unspecified acute lower respiratory infection: Secondary | ICD-10-CM | POA: Diagnosis not present

## 2021-06-23 DIAGNOSIS — R0789 Other chest pain: Secondary | ICD-10-CM | POA: Diagnosis not present

## 2021-06-23 DIAGNOSIS — R059 Cough, unspecified: Secondary | ICD-10-CM | POA: Diagnosis not present

## 2021-06-23 DIAGNOSIS — R5383 Other fatigue: Secondary | ICD-10-CM

## 2021-06-23 MED ORDER — ALBUTEROL SULFATE 1.25 MG/3ML IN NEBU: 1.0000 | INHALATION_SOLUTION | Freq: Four times a day (QID) | RESPIRATORY_TRACT | 0 refills | Status: AC | PRN

## 2021-06-23 MED ORDER — PROMETHAZINE-DM 6.25-15 MG/5ML PO SYRP: 5.0000 mL | ORAL_SOLUTION | Freq: Four times a day (QID) | ORAL | 0 refills | Status: DC | PRN

## 2021-06-23 MED ORDER — AZITHROMYCIN 250 MG PO TABS: ORAL_TABLET | ORAL | 0 refills | Status: AC

## 2021-06-23 MED ORDER — ALBUTEROL SULFATE HFA 108 (90 BASE) MCG/ACT IN AERS: 2.0000 | INHALATION_SPRAY | Freq: Four times a day (QID) | RESPIRATORY_TRACT | 0 refills | Status: AC | PRN

## 2021-06-23 MED ORDER — AMOXICILLIN-POT CLAVULANATE 875-125 MG PO TABS: 1.0000 | ORAL_TABLET | Freq: Two times a day (BID) | ORAL | 0 refills | Status: AC
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
## 2021-06-23 NOTE — Discharge Instructions (Addendum)
Your chest x-ray is concerning for pneumonia in your right middle lobe.  I recommend that you begin antibiotics now for treatment.  Please see the list below for recommended medications, dosages and frequencies to provide relief of your current symptoms:    Augmentin (amoxicillin - clavulanic acid):  take 1 tablet twice daily for 5 days, you can take it with or without food.  This antibiotic can cause upset stomach, this will resolve once antibiotics are complete.  You are welcome to use a probiotic, eat yogurt, take Imodium while taking this medication.  Please avoid other systemic medications such as Maalox, Pepto-Bismol or milk of magnesia as they can interfere with your body's ability to absorb the antibiotics.  Z-Pak (azithromycin):  take 2 tablets the first day, then take 1 tablet daily every day thereafter until complete.  ProAir, Ventolin, Proventil (albuterol): This inhaled medication contains a short acting beta agonist bronchodilator.  This medication works on the smooth muscle that opens and constricts of your airways by relaxing the muscle.  The result of relaxation of the smooth muscle is increased air movement and improved work of breathing.  This is a short acting medication that can be used every 4-6 hours as needed for increased work of breathing, shortness of breath, wheezing and excessive coughing.  I have provided you with a prescription for both the puffer and the respules to use in the nebulizer machine.    Promethazine DM: Promethazine is both a nasal decongestant and an antinausea medication that makes most patients feel fairly sleepy.  The DM is dextromethorphan, a cough suppressant found in many over-the-counter cough medications.  Please take 5 mL before bedtime to minimize your cough which will help you sleep better.  I have sent a prescription for this medication to your pharmacy.   Please follow-up within the next 3-5 days either with your primary care provider or urgent  care if your symptoms do not resolve.  If you do not have a primary care provider, we will assist you in finding one.   Thank you for visiting urgent care today.  We appreciate the opportunity to participate in your care.

## 2021-06-23 NOTE — ED Triage Notes (Signed)
Pt here with cough, fatigue and night sweats x 4 days.

## 2022-08-15 ENCOUNTER — Ambulatory Visit (INDEPENDENT_AMBULATORY_CARE_PROVIDER_SITE_OTHER): Payer: 59

## 2022-08-15 ENCOUNTER — Encounter (HOSPITAL_COMMUNITY): Payer: Self-pay

## 2022-08-15 ENCOUNTER — Ambulatory Visit (HOSPITAL_COMMUNITY)
Admission: EM | Admit: 2022-08-15 | Discharge: 2022-08-15 | Disposition: A | Discharge status: Home / Self Care | Payer: 59 | Attending: Physician Assistant | Admitting: Physician Assistant

## 2022-08-15 DIAGNOSIS — R0782 Intercostal pain: Secondary | ICD-10-CM

## 2022-08-15 DIAGNOSIS — M546 Pain in thoracic spine: Secondary | ICD-10-CM | POA: Diagnosis not present

## 2022-08-15 DIAGNOSIS — S20222A Contusion of left back wall of thorax, initial encounter: Secondary | ICD-10-CM

## 2022-08-15 MED ORDER — BACLOFEN 5 MG PO TABS: 5.0000 mg | ORAL_TABLET | Freq: Two times a day (BID) | ORAL | 0 refills | Status: AC | PRN

## 2022-08-15 MED ORDER — LIDOCAINE 5 % EX PTCH: 1.0000 | MEDICATED_PATCH | CUTANEOUS | 0 refills | Status: AC
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
## 2022-08-15 NOTE — Discharge Instructions (Signed)
Your x-ray was normal with no evidence of a broken rib or other fracture/broken bone.  This is good news.  I suspect that you have bruised the ribs causing your ongoing symptoms.  Use lidocaine patches during the day and then remove them at night; use only 1 patch per 24 hours.  I have also called in a muscle relaxer to help with the pain.  This will make you sleepy so do not drive or drink alcohol taking it.  You can use over-the-counter medication such as Tylenol for additional symptom relief.  Use the incentive spirometer 3 times a day to ensure that do not end up with lung issues related to the injury.  Follow-up with your primary care as soon as possible.  If anything worsens and you have worsening cough, fever, shortness of breath, increasing pain you should be reevaluated.

## 2022-08-15 NOTE — ED Triage Notes (Signed)
Pt states tripped and fell Wednesday night. Pt c/o lt rib pain. States hurts to cough and take a deep breath. States took pain pills but didn't help.

## 2022-09-29 ENCOUNTER — Ambulatory Visit: Payer: Self-pay

## 2022-09-30 ENCOUNTER — Ambulatory Visit: Payer: Self-pay

## 2023-06-05 DIAGNOSIS — M150 Primary generalized (osteo)arthritis: Secondary | ICD-10-CM | POA: Diagnosis not present

## 2023-06-05 DIAGNOSIS — E782 Mixed hyperlipidemia: Secondary | ICD-10-CM | POA: Diagnosis not present

## 2023-06-05 DIAGNOSIS — I10 Essential (primary) hypertension: Secondary | ICD-10-CM | POA: Diagnosis not present

## 2023-06-05 DIAGNOSIS — I25118 Atherosclerotic heart disease of native coronary artery with other forms of angina pectoris: Secondary | ICD-10-CM | POA: Diagnosis not present

## 2023-08-05 DIAGNOSIS — S20211A Contusion of right front wall of thorax, initial encounter: Secondary | ICD-10-CM | POA: Diagnosis not present

## 2023-08-05 DIAGNOSIS — M25512 Pain in left shoulder: Secondary | ICD-10-CM | POA: Diagnosis not present

## 2023-08-05 DIAGNOSIS — R748 Abnormal levels of other serum enzymes: Secondary | ICD-10-CM | POA: Diagnosis not present

## 2023-08-05 DIAGNOSIS — Z131 Encounter for screening for diabetes mellitus: Secondary | ICD-10-CM | POA: Diagnosis not present

## 2023-08-05 DIAGNOSIS — Z0000 Encounter for general adult medical examination without abnormal findings: Secondary | ICD-10-CM | POA: Diagnosis not present

## 2023-08-05 DIAGNOSIS — I2510 Atherosclerotic heart disease of native coronary artery without angina pectoris: Secondary | ICD-10-CM | POA: Diagnosis not present

## 2023-08-05 DIAGNOSIS — Z13220 Encounter for screening for lipoid disorders: Secondary | ICD-10-CM | POA: Diagnosis not present

## 2023-08-05 DIAGNOSIS — W19XXXA Unspecified fall, initial encounter: Secondary | ICD-10-CM | POA: Diagnosis not present

## 2023-10-02 DIAGNOSIS — W19XXXA Unspecified fall, initial encounter: Secondary | ICD-10-CM | POA: Diagnosis not present

## 2023-10-02 DIAGNOSIS — S6991XA Unspecified injury of right wrist, hand and finger(s), initial encounter: Secondary | ICD-10-CM | POA: Diagnosis not present

## 2023-10-02 DIAGNOSIS — R110 Nausea: Secondary | ICD-10-CM | POA: Diagnosis not present

## 2023-10-05 DIAGNOSIS — I255 Ischemic cardiomyopathy: Secondary | ICD-10-CM | POA: Diagnosis not present

## 2023-10-05 DIAGNOSIS — E782 Mixed hyperlipidemia: Secondary | ICD-10-CM | POA: Diagnosis not present

## 2023-10-05 DIAGNOSIS — I25118 Atherosclerotic heart disease of native coronary artery with other forms of angina pectoris: Secondary | ICD-10-CM | POA: Diagnosis not present

## 2023-10-05 DIAGNOSIS — S6991XA Unspecified injury of right wrist, hand and finger(s), initial encounter: Secondary | ICD-10-CM | POA: Diagnosis not present

## 2023-10-05 DIAGNOSIS — I10 Essential (primary) hypertension: Secondary | ICD-10-CM | POA: Diagnosis not present

## 2023-10-05 DIAGNOSIS — M79641 Pain in right hand: Secondary | ICD-10-CM | POA: Diagnosis not present

## 2023-10-06 DIAGNOSIS — G8929 Other chronic pain: Secondary | ICD-10-CM | POA: Diagnosis not present

## 2023-10-06 DIAGNOSIS — H9190 Unspecified hearing loss, unspecified ear: Secondary | ICD-10-CM | POA: Diagnosis not present

## 2023-10-06 DIAGNOSIS — Z23 Encounter for immunization: Secondary | ICD-10-CM | POA: Diagnosis not present

## 2023-10-06 DIAGNOSIS — Z720 Tobacco use: Secondary | ICD-10-CM | POA: Diagnosis not present

## 2023-10-06 DIAGNOSIS — Z5181 Encounter for therapeutic drug level monitoring: Secondary | ICD-10-CM | POA: Diagnosis not present

## 2023-10-06 DIAGNOSIS — Z0000 Encounter for general adult medical examination without abnormal findings: Secondary | ICD-10-CM | POA: Diagnosis not present

## 2023-10-20 DIAGNOSIS — J069 Acute upper respiratory infection, unspecified: Secondary | ICD-10-CM | POA: Diagnosis not present

## 2023-10-20 DIAGNOSIS — I491 Atrial premature depolarization: Secondary | ICD-10-CM | POA: Diagnosis not present

## 2023-10-20 DIAGNOSIS — Z720 Tobacco use: Secondary | ICD-10-CM | POA: Diagnosis not present

## 2023-10-29 DIAGNOSIS — G8929 Other chronic pain: Secondary | ICD-10-CM | POA: Diagnosis not present

## 2023-10-29 DIAGNOSIS — M533 Sacrococcygeal disorders, not elsewhere classified: Secondary | ICD-10-CM | POA: Diagnosis not present

## 2023-10-29 DIAGNOSIS — M25562 Pain in left knee: Secondary | ICD-10-CM | POA: Diagnosis not present

## 2023-10-29 DIAGNOSIS — M47816 Spondylosis without myelopathy or radiculopathy, lumbar region: Secondary | ICD-10-CM | POA: Diagnosis not present

## 2023-10-29 DIAGNOSIS — M25552 Pain in left hip: Secondary | ICD-10-CM | POA: Diagnosis not present

## 2023-10-29 DIAGNOSIS — M25561 Pain in right knee: Secondary | ICD-10-CM | POA: Diagnosis not present

## 2023-11-02 DIAGNOSIS — Z136 Encounter for screening for cardiovascular disorders: Secondary | ICD-10-CM | POA: Diagnosis not present

## 2023-11-02 DIAGNOSIS — Z87891 Personal history of nicotine dependence: Secondary | ICD-10-CM | POA: Diagnosis not present

## 2023-11-02 DIAGNOSIS — Z720 Tobacco use: Secondary | ICD-10-CM | POA: Diagnosis not present

## 2023-11-05 ENCOUNTER — Telehealth: Payer: Self-pay

## 2023-11-05 DIAGNOSIS — M129 Arthropathy, unspecified: Secondary | ICD-10-CM | POA: Diagnosis not present

## 2023-11-05 DIAGNOSIS — Z131 Encounter for screening for diabetes mellitus: Secondary | ICD-10-CM | POA: Diagnosis not present

## 2023-11-05 DIAGNOSIS — R0602 Shortness of breath: Secondary | ICD-10-CM | POA: Diagnosis not present

## 2023-11-05 DIAGNOSIS — Z6822 Body mass index (BMI) 22.0-22.9, adult: Secondary | ICD-10-CM | POA: Diagnosis not present

## 2023-11-05 DIAGNOSIS — M545 Low back pain, unspecified: Secondary | ICD-10-CM | POA: Diagnosis not present

## 2023-11-05 DIAGNOSIS — E7800 Pure hypercholesterolemia, unspecified: Secondary | ICD-10-CM | POA: Diagnosis not present

## 2023-11-05 DIAGNOSIS — R030 Elevated blood-pressure reading, without diagnosis of hypertension: Secondary | ICD-10-CM | POA: Diagnosis not present

## 2023-11-05 DIAGNOSIS — M542 Cervicalgia: Secondary | ICD-10-CM | POA: Diagnosis not present

## 2023-11-05 DIAGNOSIS — Z79899 Other long term (current) drug therapy: Secondary | ICD-10-CM | POA: Diagnosis not present

## 2023-11-05 DIAGNOSIS — M546 Pain in thoracic spine: Secondary | ICD-10-CM | POA: Diagnosis not present

## 2023-11-05 NOTE — Telephone Encounter (Signed)
 Insurance flagged [facility] as PCP. LMOM to get patient established or make note if they have another PCP.

## 2023-11-06 DIAGNOSIS — M546 Pain in thoracic spine: Secondary | ICD-10-CM | POA: Diagnosis not present

## 2023-11-06 DIAGNOSIS — M545 Low back pain, unspecified: Secondary | ICD-10-CM | POA: Diagnosis not present

## 2023-11-23 NOTE — Telephone Encounter (Signed)
 Patient is on the [facility] list as not completing a PCP visit for 2025. PCP listed is [facility].    Spoke to patient and their spouse. They stated patient seeing provider with [facility]. Informed that [facility] will update their PCP attribution information if the patient informs [employer]. Patient and spouse stated understanding.
